# Patient Record
Sex: Male | Born: 1955 | Race: White | Hispanic: No | Marital: Married | Smoking: Current every day smoker
Health system: Southern US, Community
[De-identification: ages and names within clinical notes are randomized; demographics above are authoritative.]

## PROBLEM LIST (undated history)

## (undated) DIAGNOSIS — E538 Deficiency of other specified B group vitamins: Secondary | ICD-10-CM

## (undated) DIAGNOSIS — I514 Myocarditis, unspecified: Secondary | ICD-10-CM

## (undated) DIAGNOSIS — H905 Unspecified sensorineural hearing loss: Secondary | ICD-10-CM

## (undated) DIAGNOSIS — K219 Gastro-esophageal reflux disease without esophagitis: Secondary | ICD-10-CM

## (undated) DIAGNOSIS — N400 Enlarged prostate without lower urinary tract symptoms: Secondary | ICD-10-CM

## (undated) DIAGNOSIS — K224 Dyskinesia of esophagus: Secondary | ICD-10-CM

## (undated) HISTORY — DX: Unspecified sensorineural hearing loss: H90.5

## (undated) HISTORY — DX: Benign prostatic hyperplasia without lower urinary tract symptoms: N40.0

## (undated) HISTORY — DX: Gastro-esophageal reflux disease without esophagitis: K21.9

## (undated) HISTORY — DX: Myocarditis, unspecified: I51.4

## (undated) HISTORY — DX: Deficiency of other specified B group vitamins: E53.8

## (undated) HISTORY — PX: GALLBLADDER SURGERY: SHX652

## (undated) HISTORY — DX: Dyskinesia of esophagus: K22.4

## (undated) HISTORY — DX: Gilbert syndrome: E80.4

---

## 2001-03-16 DIAGNOSIS — E785 Hyperlipidemia, unspecified: Secondary | ICD-10-CM

## 2001-03-16 HISTORY — DX: Hyperlipidemia, unspecified: E78.5

## 2010-04-30 DIAGNOSIS — F17209 Nicotine dependence, unspecified, with unspecified nicotine-induced disorders: Secondary | ICD-10-CM | POA: Insufficient documentation

## 2010-04-30 HISTORY — DX: Nicotine dependence, unspecified, with unspecified nicotine-induced disorders: F17.209

## 2011-11-05 DIAGNOSIS — Z77090 Contact with and (suspected) exposure to asbestos: Secondary | ICD-10-CM

## 2011-11-05 HISTORY — DX: Contact with and (suspected) exposure to asbestos: Z77.090

## 2011-12-13 DIAGNOSIS — D239 Other benign neoplasm of skin, unspecified: Secondary | ICD-10-CM

## 2011-12-13 HISTORY — DX: Other benign neoplasm of skin, unspecified: D23.9

## 2015-09-21 DIAGNOSIS — M160 Bilateral primary osteoarthritis of hip: Secondary | ICD-10-CM

## 2015-09-21 DIAGNOSIS — S22000A Wedge compression fracture of unspecified thoracic vertebra, initial encounter for closed fracture: Secondary | ICD-10-CM | POA: Insufficient documentation

## 2015-09-21 DIAGNOSIS — M519 Unspecified thoracic, thoracolumbar and lumbosacral intervertebral disc disorder: Secondary | ICD-10-CM | POA: Insufficient documentation

## 2015-09-21 DIAGNOSIS — J449 Chronic obstructive pulmonary disease, unspecified: Secondary | ICD-10-CM

## 2015-09-21 HISTORY — DX: Wedge compression fracture of unspecified thoracic vertebra, initial encounter for closed fracture: S22.000A

## 2015-09-21 HISTORY — DX: Chronic obstructive pulmonary disease, unspecified: J44.9

## 2015-09-21 HISTORY — DX: Bilateral primary osteoarthritis of hip: M16.0

## 2015-09-21 HISTORY — DX: Unspecified thoracic, thoracolumbar and lumbosacral intervertebral disc disorder: M51.9

## 2017-02-24 DIAGNOSIS — R739 Hyperglycemia, unspecified: Secondary | ICD-10-CM

## 2017-02-24 HISTORY — DX: Hyperglycemia, unspecified: R73.9

## 2017-03-03 DIAGNOSIS — D751 Secondary polycythemia: Secondary | ICD-10-CM | POA: Insufficient documentation

## 2017-03-03 HISTORY — DX: Secondary polycythemia: D75.1

## 2018-04-28 DIAGNOSIS — F329 Major depressive disorder, single episode, unspecified: Secondary | ICD-10-CM

## 2018-04-28 HISTORY — DX: Major depressive disorder, single episode, unspecified: F32.9

## 2019-02-23 DIAGNOSIS — R292 Abnormal reflex: Secondary | ICD-10-CM

## 2019-02-23 HISTORY — DX: Abnormal reflex: R29.2

## 2019-06-21 ENCOUNTER — Encounter: Payer: Self-pay | Admitting: Neurology

## 2019-06-29 ENCOUNTER — Encounter: Payer: Self-pay | Admitting: Neurology

## 2019-07-25 NOTE — Progress Notes (Addendum)
NEUROLOGY CONSULTATION NOTE  Dennis Dalton MRN: [id number] DOB: 09/25/1955  Referring provider: Eber Hong, MD Primary care provider: Eber Hong, MD  Reason for consult:  Memory deficits, speech abnormality, abnormal MRI  HISTORY OF PRESENT ILLNESS: Dennis Dalton is a 63 year old right-handed male who presents for cognitive deficits and speech disturbance.  He is accompanied by his wife who supplements most of history.  In 2018, he started exhibiting cognitive deficits.  He has word-finding difficulty.  He has short-term memory issues such as forgetting recent conversations.  He frequently repeat questions.  He has some trouble recognizing familiar people.  One time, he couldn't remember his daughter's name.  He has little trouble with everyday tasks.  He is able to perform ADLs such as dressing, bathing, and feeding self.  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  He has not had any problems performing his duties at work.  He keeps tabs of multiple keys and knows which keys unlock specific doors.  He was at a stop light and briefly zoned out for a couple of seconds.  It was believed that he may have had a TIA.  He reports that he does not get lost on familiar routes.  He does report depressed mood.  He does not want to be around people.  He has not had any episodes of confusion.  No hallucinations or delusions.  Symptoms have mildly progressed since onset.  MRI of brain without contrast on 04/21/2017 showed T2 and FLAIR hyperintensities in the subcortical, deep and periventricular regions.  Report notes that orientation was perpendicular to the ventricles, raising concern for multiple sclerosis.  He followed up with neurology.  He underwent LP which did not reveal findings consistent with multiple sclerosis.  Neurology believed findings were consistent with chronic small vessel ischemic changes.  He was found to have B12 deficiency and started on supplementation.   Education:   12th grade Smoker since age 43.  1 ppd No liquor.  Drinks 3 to 4 beers a week.  He was a heavy drinker for many years up until a year ago. Brothers passed away at young age.  Parents no dementia.    02/23/2019 LABS:  CBC with WBC 9, HGB 16, HCT 45.7, PLT 215; CMP with Na 136, K 4.2, Cl 101, CO2 25, BUN 12. Cr 0.90, t bili 1.6, ALP 58, AST 12. ALT 17; B12 928; Sed rate 2, CRP <0.40 10/25/2018 LABS:  TSH 0.74  PAST MEDICAL HISTORY: Past Medical History:  Diagnosis Date  . B12 deficiency   . Depression     PAST SURGICAL HISTORY: Past Surgical History:  Procedure Laterality Date  . GALLBLADDER SURGERY      MEDICATIONS: Current Outpatient Medications on File Prior to Visit  Medication Sig Dispense Refill  . Apoaequorin (PREVAGEN) 10 MG CAPS Take by mouth.    Marland Kitchen aspirin EC 81 MG tablet Take by mouth.    . cyanocobalamin (,VITAMIN B-12,) 1000 MCG/ML injection Inject into the muscle.    Marland Kitchen SYRINGE-NEEDLE, DISP, 3 ML (B-D INTEGRA SYRINGE) 25G X 5/8" 3 ML MISC by Does not apply route.    Marland Kitchen UNABLE TO FIND NEURIVA OTC TAKE ONE CAPSULE A DAY     No current facility-administered medications on file prior to visit.     ALLERGIES: No Known Allergies  FAMILY HISTORY: Family History  Problem Relation Age of Onset  . Lung cancer Mother        smoker  .  Lung cancer Father        smoker  . Heart Problems Brother        enlarge heart x1 1 brother death due to car accident   . Drug abuse Brother   . Healthy Child   .  SOCIAL HISTORY: Social History   Socioeconomic History  . Marital status: Married    Spouse name: Not on file  . Number of children: 2  . Years of education: Not on file  . Highest education level: High school graduate  Occupational History  . Not on file  Social Needs  . Financial resource strain: Not on file  . Food insecurity    Worry: Not on file    Inability: Not on file  . Transportation needs    Medical: Not on file    Non-medical: Not on file  Tobacco Use   . Smoking status: Current Every Day Smoker  . Smokeless tobacco: Never Used  Substance and Sexual Activity  . Alcohol use: Yes    Alcohol/week: 1.0 standard drinks    Types: 1 Cans of beer per week  . Drug use: Never  . Sexual activity: Not on file  Lifestyle  . Physical activity    Days per week: Not on file    Minutes per session: Not on file  . Stress: Not on file  Relationships  . Social Herbalist on phone: Not on file    Gets together: Not on file    Attends religious service: Not on file    Active member of club or organization: Not on file    Attends meetings of clubs or organizations: Not on file    Relationship status: Not on file  . Intimate partner violence    Fear of current or ex partner: Not on file    Emotionally abused: Not on file    Physically abused: Not on file    Forced sexual activity: Not on file  Other Topics Concern  . Not on file  Social History Narrative   Pt is married lives with spouse   He has 5 children total 3 are biological children   Right handed   Drinks soda 4-5 a day, no coffee, no tea    REVIEW OF SYSTEMS: Constitutional: No fevers, chills, or sweats, no generalized fatigue, change in appetite Eyes: No visual changes, double vision, eye pain Ear, nose and throat: No hearing loss, ear pain, nasal congestion, sore throat Cardiovascular: No chest pain, palpitations Respiratory:  No shortness of breath at rest or with exertion, wheezes GastrointestinaI: No nausea, vomiting, diarrhea, abdominal pain, fecal incontinence Genitourinary:  No dysuria, urinary retention or frequency Musculoskeletal:  No neck pain, back pain Integumentary: No rash, pruritus, skin lesions Neurological: as above Psychiatric: No depression, insomnia, anxiety Endocrine: No palpitations, fatigue, diaphoresis, mood swings, change in appetite, change in weight, increased thirst Hematologic/Lymphatic:  No purpura, petechiae. Allergic/Immunologic: no  itchy/runny eyes, nasal congestion, recent allergic reactions, rashes  PHYSICAL EXAM: Blood pressure 132/79, pulse 76, height 5\' 9"  (1.753 m), weight 147 lb 3.2 oz (66.8 kg), SpO2 97 %. General: No acute distress.  Patient appears well-groomed.   Head:  Normocephalic/atraumatic Eyes:  fundi examined but not visualized Neck: supple, no paraspinal tenderness, full range of motion Back: No paraspinal tenderness Heart: regular rate and rhythm Lungs: Clear to auscultation bilaterally. Vascular: No carotid bruits. Neurological Exam: Mental status: [employer] Mental Exam 07/26/2019  Weekday Correct 0  Current year 0  What  state are we in? 1  Amount spent 0  Amount left 0  # of Animals 1  5 objects recall 0  Number series 0  Hour markers 1  Time correct 0  Placed X in triangle correctly 1  Largest Figure 1  Name of male 0  Date back to work 0  Type of work 0  State she lived in 0  Total score 5   Cranial nerves: CN I: not tested CN II: pupils equal, round and reactive to light, visual fields intact CN III, IV, VI:  full range of motion, no nystagmus, no ptosis CN V: facial sensation intact CN VII: upper and lower face symmetric CN VIII: hearing intact CN IX, X: gag intact, uvula midline CN XI: sternocleidomastoid and trapezius muscles intact CN XII: tongue midline Bulk & Tone: normal, no fasciculations. Motor:  5/5 throughout Sensation: temperature and vibration sensation intact. Deep Tendon Reflexes:  3+ throughout, toes downgoing.  Finger to nose testing:  Without dysmetria.  Heel to shin:  Without dysmetria.  Gait:  Normal station and stride.  Able to turn and tandem walk. Romberg negative.  IMPRESSION: 1.  Neurocognitive disorder.  He scored very low on SLUMS, suggesting severe dementia.  However, he appears to be more independent than his performance on cognitive testing.  This may suggest that other factors, such as depression and decreased effort, have  played a role in his poor performance.  Regardless, based on this score, I have advised that he should not drive until further notice.  Cerebrovascular disease and history of alcoholism may be probable etiology for his cognitive impairment.   2.  Abnormal white matter on brain MRI.  Questionable multiple sclerosis.  Prior workup reportedly negative for MS.  Likely cerebrovascular disease.  Imaging not available.  Will repeat. 3.  Hyperreflexia.  May be physiologic but given his prior MRI brain findings, I would like to image the cervical spine to evaluate for any potential lesion causing a myelopathy.  PLAN: 1.  Check MRI of brain and cervical spine with and without contrast 2.  Check B1, B12, TSH, RPR 3.  Recommend formal neuropsychological testing for more sensitive evaluation of cognition and help determine if severity is also affected by other factors such as depression.  Until this is performed, he should not drive until further notice. 4.  Suggested starting donepezil.  He defers until after testing completed. 5.  Management of stroke risk factors:  ASA, blood pressure and cholesterol control 6.  Follow up after testing.  7.  ADDENDUM:  Start donepezil 5mg  at bedtime for one month, then increase to 10mg  at bedtime  Thank you for allowing me to take part in the care of this patient.  Metta Clines, DO  CC:  Eber Hong, MD

## 2019-07-26 ENCOUNTER — Ambulatory Visit (INDEPENDENT_AMBULATORY_CARE_PROVIDER_SITE_OTHER): Payer: 59 | Admitting: Neurology

## 2019-07-26 ENCOUNTER — Encounter: Payer: Self-pay | Admitting: Neurology

## 2019-07-26 ENCOUNTER — Other Ambulatory Visit: Payer: Self-pay

## 2019-07-26 ENCOUNTER — Other Ambulatory Visit (INDEPENDENT_AMBULATORY_CARE_PROVIDER_SITE_OTHER): Payer: 59

## 2019-07-26 ENCOUNTER — Ambulatory Visit: Payer: 59 | Admitting: Neurology

## 2019-07-26 VITALS — BP 132/79 | HR 76 | Ht 69.0 in | Wt 147.2 lb

## 2019-07-26 DIAGNOSIS — R9082 White matter disease, unspecified: Secondary | ICD-10-CM | POA: Diagnosis not present

## 2019-07-26 DIAGNOSIS — F17200 Nicotine dependence, unspecified, uncomplicated: Secondary | ICD-10-CM

## 2019-07-26 DIAGNOSIS — R292 Abnormal reflex: Secondary | ICD-10-CM | POA: Diagnosis not present

## 2019-07-26 DIAGNOSIS — F1011 Alcohol abuse, in remission: Secondary | ICD-10-CM

## 2019-07-26 DIAGNOSIS — R419 Unspecified symptoms and signs involving cognitive functions and awareness: Secondary | ICD-10-CM

## 2019-07-26 NOTE — Patient Instructions (Addendum)
The score on the cognitive test suggests dementia.  However, other factors such as depression may be playing a role. 1.  We will check blood work:  TSH, B12, B1, RPR 2.  I want to schedule MRI of brain and cervical spine with and without contrast 3.  We will schedule for neurocognitive testing with Dr. Melvyn Novas 4.  It is my medical opinion that you should not drive until further notice. 5.  Follow up after all testing completed (it may be a virtual visit)  Your provider has requested that you have labwork completed today. Please go to [facility] ([address]) on the second floor of this building before leaving the office today. You do not need to check in. If you are not called within 15 minutes please check with the front desk.   A referral to [facility] has been placed for your MRI someone will contact you directly to schedule your appt. They are located at [place]. Please contact them directly by calling [phone] with any questions regarding your referral.    You have been referred for a neurocognitive evaluation in our office.   The evaluation has two parts.   . The first part of the evaluation is a clinical interview with the neuropsychologist (Dr. Melvyn Novas or Dr. Nicole Kindred). Please bring someone with you to this appointment if possible, as it is helpful for the doctor to hear from both you and another adult who knows you well.   . The second part of the evaluation is testing with the doctor's technician Hinton Dyer or Maudie Mercury). The testing includes a variety of tasks- mostly question-and-answer, some paper-and-pencil. There is nothing you need to do to prepare for this appointment, but having a good night's sleep prior to the testing, taking medications as you normally would, and bringing eyeglasses and hearing aids (if you wear them), is advised. Please make sure that you wear a mask to the appointment.  Please note: We have to reserve several hours of the  neuropsychologist's time and the psychometrician's time for your evaluation appointment. As such, please note that there is a No-Show fee of $100. If you are unable to attend any of your appointments, please contact our office as soon as possible to reschedule.

## 2019-07-27 LAB — VITAMIN B12: Vitamin B-12: 594 pg/mL (ref 211–911)

## 2019-07-27 LAB — TSH: TSH: 0.72 u[IU]/mL (ref 0.35–4.50)

## 2019-07-28 ENCOUNTER — Telehealth: Payer: Self-pay | Admitting: Neurology

## 2019-07-28 MED ORDER — DONEPEZIL HCL 5 MG PO TABS: 5.0000 mg | ORAL_TABLET | Freq: Every day | ORAL | 5 refills | Status: DC

## 2019-07-28 NOTE — Telephone Encounter (Signed)
Left message with after hour service on 07-28-19 @ 12:28 pm   Caller wants to speak to someone about a medication she would like to husband to take she is wanting to know if the medication RX can be faxed to pharmacy

## 2019-07-28 NOTE — Telephone Encounter (Signed)
Yes.  I wanted to start donepezil We will start donepezil (Aricept) 5mg  daily for four weeks.  If you are tolerating the medication, then after four weeks, we will increase the dose to 10mg  daily.  Side effects include nausea, vomiting, diarrhea, vivid dreams, and muscle cramps.  Please call the clinic if you experience any of these symptoms.

## 2019-07-28 NOTE — Telephone Encounter (Addendum)
See most recent telephone note Rx was sent this morning.  Will call spouse back to discuss Spoke with spouse she was informed of provider response from prior telephone note

## 2019-07-28 NOTE — Telephone Encounter (Signed)
PLAN: 1.  Check MRI of brain and cervical spine with and without contrast 2.  Check B1, B12, TSH, RPR 3.  Recommend formal neuropsychological testing for more sensitive evaluation of cognition and help determine if severity is also affected by other factors such as depression.  Until this is performed, he should not drive until further notice. 4.  Suggested starting donepezil.  He defers until after testing completed. 5.  Management of stroke risk factors:  ASA, blood pressure and cholesterol control 6.  Follow up after testing.     ~Dr. Tomi Likens Was there something else you were going to start patient on?

## 2019-07-28 NOTE — Telephone Encounter (Signed)
Rx sent Will informed spouse when she returns call

## 2019-07-28 NOTE — Telephone Encounter (Signed)
Wife is calling in about the new medication that was supposed to be called in. She couldn't remember the name was talked about during last visit. Please send to the [facility] on file. Thanks!

## 2019-07-28 NOTE — Telephone Encounter (Signed)
Tried calling patient spouse again no answer left provider response below

## 2019-07-30 LAB — VITAMIN B1: Vitamin B1 (Thiamine): 7 nmol/L — ABNORMAL LOW (ref 8–30)

## 2019-07-30 LAB — RPR: RPR Ser Ql: NONREACTIVE

## 2019-08-01 ENCOUNTER — Telehealth: Payer: Self-pay | Admitting: Neurology

## 2019-08-01 ENCOUNTER — Telehealth: Payer: Self-pay

## 2019-08-01 NOTE — Telephone Encounter (Signed)
-----   Message from Pieter Partridge, DO sent at 07/31/2019  2:33 PM EST ----- B1 (thiamine) level is borderline low.  This may affect memory.  I would like him to start thiamine 100mg  daily.  Other labs are okay

## 2019-08-01 NOTE — Telephone Encounter (Signed)
Please call wife regarding conversation with patient. She also has a question about Prevagen and MRI being scheduled. Please Call wife on Cell phone. Thanks

## 2019-08-01 NOTE — Telephone Encounter (Signed)
Called patient spouse no answer left message to call office back to discuss her questions and concerns

## 2019-08-01 NOTE — Telephone Encounter (Signed)
Called spoke with patient he has started medication and will call office in 4 weeks to send in new rx to increase dose.

## 2019-08-01 NOTE — Telephone Encounter (Signed)
Pt spouse return call she was informed of lab results and that patient should start Thiamine 100 mg daily. She was also given number to [place] regarding scheduling his appt.

## 2019-08-01 NOTE — Telephone Encounter (Signed)
Called spoke with patient he was informed of results and understands will pick up otc thiamine 100 mg at local pharmacy.

## 2019-08-25 ENCOUNTER — Telehealth: Payer: Self-pay | Admitting: Neurology

## 2019-08-25 NOTE — Telephone Encounter (Signed)
Tried calling. No answer. Pt does have (2) MRI's scheduled on 09/03/18 at [facility]. He needs to arrive at 2:20 alone and with a mask.  Wife informed

## 2019-08-25 NOTE — Telephone Encounter (Signed)
Patient has not heard from Imaging office to set up MRI. He was checking on the status of the Referral. Please Call. Thank you

## 2019-09-04 ENCOUNTER — Other Ambulatory Visit: Payer: 59

## 2019-09-11 ENCOUNTER — Other Ambulatory Visit: Payer: Self-pay | Admitting: Neurology

## 2019-09-11 ENCOUNTER — Ambulatory Visit
Admission: RE | Admit: 2019-09-11 | Discharge: 2019-09-11 | Disposition: A | Discharge status: Home / Self Care | Payer: 59 | Source: Ambulatory Visit | Attending: Neurology | Admitting: Neurology

## 2019-09-11 ENCOUNTER — Other Ambulatory Visit: Payer: Self-pay

## 2019-09-11 DIAGNOSIS — R419 Unspecified symptoms and signs involving cognitive functions and awareness: Secondary | ICD-10-CM

## 2019-09-11 DIAGNOSIS — F17200 Nicotine dependence, unspecified, uncomplicated: Secondary | ICD-10-CM

## 2019-09-11 DIAGNOSIS — R9082 White matter disease, unspecified: Secondary | ICD-10-CM

## 2019-09-11 DIAGNOSIS — F1011 Alcohol abuse, in remission: Secondary | ICD-10-CM

## 2019-09-11 DIAGNOSIS — R292 Abnormal reflex: Secondary | ICD-10-CM

## 2019-09-12 ENCOUNTER — Telehealth: Payer: Self-pay

## 2019-09-12 NOTE — Telephone Encounter (Signed)
Pt called informed of MRI results per Dr Tomi Likens MRI of brain again shows the abnormal findings seen on previous MRI.  It may be extensive chronic changes from little strokes.  I would continue aspirin 81mg  daily.  He may make a virtual visit or telephone visit for more explanation

## 2019-09-19 ENCOUNTER — Telehealth: Payer: Self-pay | Admitting: Neurology

## 2019-09-19 ENCOUNTER — Other Ambulatory Visit: Payer: Self-pay

## 2019-09-19 MED ORDER — DONEPEZIL HCL 5 MG PO TABS: 5.0000 mg | ORAL_TABLET | Freq: Two times a day (BID) | ORAL | 5 refills | Status: DC

## 2019-09-19 NOTE — Telephone Encounter (Signed)
Pharm told wife that DR will need to send an updated prescription for the change of the donepezil medication. Changed to 10mg  or 2 tablets of the 5mg . Pharm on file is correct. Thanks!

## 2019-09-28 NOTE — Progress Notes (Signed)
Virtual Visit via Video Note The purpose of this virtual visit is to provide medical care while limiting exposure to the novel coronavirus.    Consent was obtained for video visit:  Yes.   Answered questions that patient had about telehealth interaction:  Yes.   I discussed the limitations, risks, security and privacy concerns of performing an evaluation and management service by telemedicine. I also discussed with the patient that there may be a patient responsible charge related to this service. The patient expressed understanding and agreed to proceed.  Pt location: Home Physician Location: office Name of referring provider:  Eber Hong, MD I connected with Dennis Dalton at patients initiation/request on 09/30/2019 at  8:50 AM EST by video enabled telemedicine application and verified that I am speaking with the correct person using two identifiers. Pt MRN:  [id number] Pt DOB:  Dec 11, 1955 Video Participants:  Dennis Dalton; his wife   History of Present Illness:  Dennis Dalton is a 64 year old right-handed male who follows up for cognitive deficits and speech disturbance.  MRIs personally reviewed.  UPDATE: Workup: 07/26/2019 LABS:  TSH 0.72, B12 594, RPR nonreactive.  B1 borderline low at 7.  Advised to start thiamine 100mg  daily. 09/11/2019 MRI BRAIN WO:  Extensive patchy and confluent cerebral white matter T2 and FLAIR signal abnormality.  Impression by radiologist included advanced chronic small vessel disease, CADASIL and less likely demyelination.  MRI CERVICAL SPINE WO:  No cord lesions.  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  He was started on donepezil and advised to continue ASA 81mg  daily.  No significant changes.  HISTORY: In 2018, he started exhibiting cognitive deficits.  He has word-finding difficulty.  He has short-term  memory issues such as forgetting recent conversations.  He frequently repeat questions.  He has some trouble recognizing familiar people.  One time, he couldn't remember his daughter's name.  He has little trouble with everyday tasks.  He is able to perform ADLs such as dressing, bathing, and feeding self.  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  He has not had any problems performing his duties at work.  He keeps tabs of multiple keys and knows which keys unlock specific doors.  He was at a stop light and briefly zoned out for a couple of seconds.  It was believed that he may have had a TIA.  He reports that he does not get lost on familiar routes.  He does report depressed mood.  He does not want to be around people.  He has not had any episodes of confusion.  No hallucinations or delusions.  Symptoms have mildly progressed since onset.  MRI of brain without contrast on 04/21/2017 showed T2 and FLAIR hyperintensities in the subcortical, deep and periventricular regions.  Report notes that orientation was perpendicular to the ventricles, raising concern for multiple sclerosis.  He followed up with neurology.  He underwent LP which did not reveal findings consistent with multiple sclerosis.  Neurology believed findings were consistent with chronic small vessel ischemic changes.  He was found to have B12 deficiency and started on supplementation.   Education:  12th grade Smoker since age 51.  1 ppd No liquor.  Drinks 3 to 4 beers a week.  He was a heavy drinker for many years up until a year ago. Brothers passed away at young age.  Parents  no dementia.    Past Medical History: Past Medical History:  Diagnosis Date   B12 deficiency    Depression     Medications: Outpatient Encounter Medications as of 09/30/2019  Medication Sig   Apoaequorin (PREVAGEN) 10 MG CAPS Take by mouth.   aspirin EC 81 MG tablet Take by mouth.   cyanocobalamin (,VITAMIN B-12,) 1000 MCG/ML  injection Inject into the muscle.   donepezil (ARICEPT) 5 MG tablet Take 1 tablet (5 mg total) by mouth 2 (two) times daily.   SYRINGE-NEEDLE, DISP, 3 ML (B-D INTEGRA SYRINGE) 25G X 5/8" 3 ML MISC by Does not apply route.   UNABLE TO FIND NEURIVA OTC TAKE ONE CAPSULE A DAY   No facility-administered encounter medications on file as of 09/30/2019.    Allergies: No Known Allergies  Family History: Family History  Problem Relation Age of Onset   Lung cancer Mother        smoker   Lung cancer Father        smoker   Heart Problems Brother        enlarge heart x1 1 brother death due to car accident    Drug abuse Brother    Healthy Child     Social History: Social History   Socioeconomic History   Marital status: Married    Spouse name: Not on file   Number of children: 2   Years of education: Not on file   Highest education level: High school graduate  Occupational History   Not on file  Tobacco Use   Smoking status: Current Every Day Smoker   Smokeless tobacco: Never Used  Substance and Sexual Activity   Alcohol use: Yes    Alcohol/week: 1.0 standard drinks    Types: 1 Cans of beer per week   Drug use: Never   Sexual activity: Not on file  Other Topics Concern   Not on file  Social History Narrative   Pt is married lives with spouse   He has 5 children total 3 are biological children   Right handed   Drinks soda 4-5 a day, no coffee, no tea   Social Determinants of Radio broadcast assistant Strain:    Difficulty of Paying Living Expenses: Not on file  Food Insecurity:    Worried About Charity fundraiser in the Last Year: Not on file   [employer] of Food in the Last Year: Not on file  Transportation Needs:    Lack of Transportation (Medical): Not on file   Lack of Transportation (Non-Medical): Not on file  Physical Activity:    Days of Exercise per Week: Not on file   Minutes of Exercise per Session: Not on file  Stress:    Feeling  of Stress : Not on file  Social Connections:    Frequency of Communication with Friends and Family: Not on file   Frequency of Social Gatherings with Friends and Family: Not on file   Attends Religious Services: Not on file   Active Member of Clubs or Organizations: Not on file   Attends Archivist Meetings: Not on file   Marital Status: Not on file  Intimate Partner Violence:    Fear of Current or Ex-Partner: Not on file   Emotionally Abused: Not on file   Physically Abused: Not on file   Sexually Abused: Not on file    Observations/Objective:   Height 5\' 9"  (1.753 m), weight 140 lb (63.5 kg). No acute distress.  Alert and oriented.  Speech fluent and not dysarthric.  Language intact.    Assessment and Plan:   1. Abnormal cerebral white matter.  Severe chronic small vessel disease.  Presentation on MRI raised possibility of CADASIL.  I do not suspect demyelinating disease. 2.  Neurocognitive disorder, likely vascular.  1.  ASA 81mg  daily 2.  Aricept 10mg  at bedtime 3.  Check CTA of head and neck 4.  Check lipid panel 5.  Neuropsychological testing scheduled in April 6.  Mrs. Sixto will contact their insurance to find out the potential expense of genetic testing for CADASIL (evaluating for NOTCH3 gene mutation). 7.  Follow up in 4 months or sooner if needed.  Follow Up Instructions:    -I discussed the assessment and treatment plan with the patient. The patient was provided an opportunity to ask questions and all were answered. The patient agreed with the plan and demonstrated an understanding of the instructions.   The patient was advised to call back or seek an in-person evaluation if the symptoms worsen or if the condition fails to improve as anticipated.   Dennis Major, DO

## 2019-09-30 ENCOUNTER — Encounter: Payer: Self-pay | Admitting: Neurology

## 2019-09-30 ENCOUNTER — Telehealth (INDEPENDENT_AMBULATORY_CARE_PROVIDER_SITE_OTHER): Payer: 59 | Admitting: Neurology

## 2019-09-30 ENCOUNTER — Other Ambulatory Visit: Payer: Self-pay

## 2019-09-30 VITALS — Ht 69.0 in | Wt 140.0 lb

## 2019-09-30 DIAGNOSIS — R419 Unspecified symptoms and signs involving cognitive functions and awareness: Secondary | ICD-10-CM | POA: Diagnosis not present

## 2019-09-30 DIAGNOSIS — R9082 White matter disease, unspecified: Secondary | ICD-10-CM

## 2019-09-30 DIAGNOSIS — I679 Cerebrovascular disease, unspecified: Secondary | ICD-10-CM

## 2019-09-30 DIAGNOSIS — R292 Abnormal reflex: Secondary | ICD-10-CM

## 2019-09-30 MED ORDER — DONEPEZIL HCL 10 MG PO TABS: 10.0000 mg | ORAL_TABLET | Freq: Every day | ORAL | 5 refills | Status: DC

## 2019-11-08 ENCOUNTER — Ambulatory Visit
Admission: RE | Admit: 2019-11-08 | Discharge: 2019-11-08 | Disposition: A | Discharge status: Home / Self Care | Payer: 59 | Source: Ambulatory Visit | Attending: Neurology | Admitting: Neurology

## 2019-11-08 ENCOUNTER — Other Ambulatory Visit: Payer: Self-pay

## 2019-11-08 DIAGNOSIS — R419 Unspecified symptoms and signs involving cognitive functions and awareness: Secondary | ICD-10-CM

## 2019-11-08 DIAGNOSIS — R9082 White matter disease, unspecified: Secondary | ICD-10-CM

## 2019-11-08 DIAGNOSIS — R292 Abnormal reflex: Secondary | ICD-10-CM

## 2019-11-08 MED ORDER — IOPAMIDOL (ISOVUE-370) INJECTION 76%
75.0000 mL | Freq: Once | INTRAVENOUS | Status: AC | PRN
  Administered 2019-11-08: 75 mL via INTRAVENOUS

## 2019-11-10 ENCOUNTER — Telehealth: Payer: Self-pay | Admitting: Neurology

## 2019-11-10 NOTE — Telephone Encounter (Signed)
Patient's wife called and left a message stating she is returning Heather's call about results.

## 2019-11-10 NOTE — Telephone Encounter (Signed)
Left message of the above

## 2019-12-06 ENCOUNTER — Ambulatory Visit: Payer: 59 | Admitting: Psychology

## 2019-12-06 ENCOUNTER — Ambulatory Visit (INDEPENDENT_AMBULATORY_CARE_PROVIDER_SITE_OTHER): Payer: 59 | Admitting: Psychology

## 2019-12-06 ENCOUNTER — Other Ambulatory Visit: Payer: Self-pay

## 2019-12-06 ENCOUNTER — Encounter: Payer: Self-pay | Admitting: Psychology

## 2019-12-06 DIAGNOSIS — N529 Male erectile dysfunction, unspecified: Secondary | ICD-10-CM | POA: Insufficient documentation

## 2019-12-06 DIAGNOSIS — F339 Major depressive disorder, recurrent, unspecified: Secondary | ICD-10-CM | POA: Diagnosis not present

## 2019-12-06 DIAGNOSIS — R4189 Other symptoms and signs involving cognitive functions and awareness: Secondary | ICD-10-CM | POA: Diagnosis not present

## 2019-12-06 DIAGNOSIS — K219 Gastro-esophageal reflux disease without esophagitis: Secondary | ICD-10-CM | POA: Insufficient documentation

## 2019-12-06 DIAGNOSIS — H905 Unspecified sensorineural hearing loss: Secondary | ICD-10-CM | POA: Insufficient documentation

## 2019-12-06 DIAGNOSIS — I514 Myocarditis, unspecified: Secondary | ICD-10-CM | POA: Insufficient documentation

## 2019-12-06 DIAGNOSIS — E538 Deficiency of other specified B group vitamins: Secondary | ICD-10-CM | POA: Insufficient documentation

## 2019-12-06 DIAGNOSIS — I428 Other cardiomyopathies: Secondary | ICD-10-CM | POA: Insufficient documentation

## 2019-12-06 DIAGNOSIS — N400 Enlarged prostate without lower urinary tract symptoms: Secondary | ICD-10-CM | POA: Insufficient documentation

## 2019-12-06 DIAGNOSIS — K224 Dyskinesia of esophagus: Secondary | ICD-10-CM | POA: Insufficient documentation

## 2019-12-06 NOTE — Progress Notes (Addendum)
   Psychometrician Note   Cognitive testing was administered to Dennis Dalton by Milana Kidney, B.S. (psychometrist) under the supervision of Dr. Christia Reading, Ph.D., licensed psychologist.   Shortly following the initiation of testing procedures, [name] expressed a desire to discontinue testing, emphasizing that he does not exhibit any cognitive difficulties and that the current testing is "not going to help me because nothing is wrong." Upon expressing this, [name] relayed these concerns to Dr. Melvyn Novas. Following a brief conversation, [name] agreed to complete approximately 30 minutes of testing representing a cognitive screening battery. However, Mr. Areola ultimately discontinued testing prematurely and left the testing office.    [name] will return within approximately 1-2 weeks for an interactive feedback session with Dr. Melvyn Novas at which time his test performances, clinical impressions, and treatment recommendations will be reviewed in detail. [name] understands he can contact our office should he require our assistance before this time.  A total of 30 minutes of billable time were spent face-to-face with [name] by the psychometrist. This includes both test administration and scoring time. Billing for these services is reflected in the clinical report generated by Dr. Christia Reading, Ph.D.  This note reflects time spent with the psychometrician and does not include test scores or any clinical interpretations made by Dr. Melvyn Novas. The full report will follow in a separate note.

## 2019-12-06 NOTE — Progress Notes (Signed)
NEUROPSYCHOLOGICAL EVALUATION [place] [place] Department of Neurology  Reason for Referral:   Dennis Dalton is a 64 y.o. right-handed Caucasian male referred by Metta Clines, D.O., to characterize his current cognitive functioning and assist with diagnostic clarity and treatment planning in the context of abnormal neuroimaging and concerns regarding cognitive decline.  Assessment and Plan:   Clinical Impression(s): Shortly following the initiation of testing procedures, Dennis Dalton expressed a desire to discontinue testing, emphasizing that he does not exhibit any cognitive difficulties and that the current testing is "not going to help me because nothing is wrong." Upon expressing this, the psychometrist relayed these concerns to myself. Following a brief conversation, Dennis Dalton agreed to complete approximately 30 minutes of testing representing a cognitive screening battery. However, Dennis Dalton ultimately discontinued testing prematurely and left the testing office, stating that he would never return. As such, a comprehensive evaluation was unable to be completed. Tasks that were completed are believed to be invalid due to limited testing compliance. They are provided at the conclusion of the report for the sake of completion but no interpretation was performed. Across mood-related questionnaires, he answered each item with a "50%" value and did not appear to actually read the symptoms he was endorsing.  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 Cognitive deficits would certainly be expected with this level of white matter abnormality, with the most likely affected cognitive domains including processing speed, attention/concentration, executive functioning, and learning  and memory. While testing was unable to be completed, there is a strong likelihood that he would meet mild vascular neurocognitive disorder diagnostic criteria. Dennis Dalton did report that his wife manages their personal finances and his medications; he was unclear if he could do this independently. Some instrumental activities of daily living (ADL) disruption raises concerns for a major vascular neurocognitive disorder (i.e., vascular dementia) presentation. However, this diagnosis appears premature as his wife denied concerns surrounding leaving her husband at home alone for extended periods of time (e.g. a weekend) and the extent of suspected cognitive dysfunction could not be objectively examined. Continued medical monitoring will be important moving forward.   Recommendations: Dennis Dalton stated that he would "never" return for cognitive testing. Should this feeling change, a comprehensive assessment would be beneficial in classifying strengths and weaknesses and lead to improved diagnostic clarity.   Genetic testing to rule in or out a CADASIL presentation would be recommended. However, the cost of this is currently unknown. In all likelihood, in a best case scenario, this would allow for greater diagnostic precision but likely would not change active treatment methods much. As such, he and his wife are encouraged to consider the pros and cons of this testing and discuss with Dr. Tomi Likens as needed.   It is imperative that Dennis Dalton take his medications as prescribed by his physicians rather than designing his own schedule (his wife suggested that he does this with some medications). Dosing schedules are created to maximize drug effectiveness and symptom reduction. This is especially true for cardiovascular-related medications aimed to slowing the progression of brain changes.   Dennis Dalton answered each mood-related question on related questionnaires with a "50%" value and did not appear to actually read  the symptoms he was endorsing. As such, the true extent of current psychiatric symptoms are unclear. However, consideration of medications to address  likely mild to moderate symptoms of anxiety and depression are worth considering.  Dennis Dalton is encouraged to attend to lifestyle factors for brain health (e.g., regular physical exercise, good nutrition habits, regular participation in cognitively-stimulating activities, and general stress management techniques), which are likely to have benefits for both emotional adjustment and cognition. In fact, in addition to promoting good general health, regular exercise incorporating aerobic activities (e.g., brisk walking, jogging, cycling, etc.) has been demonstrated to be a very effective treatment for depression and stress, with similar efficacy rates to both antidepressant medication and psychotherapy. Optimal control of vascular risk factors (including safe cardiovascular exercise and adherence to dietary recommendations) is encouraged.   Should there be a progression of his current deficits over time, Dennis Dalton is unlikely to regain any independent living skills lost. Therefore, it is recommended that he remain as involved as possible in all aspects of household chores, finances, and medication management, with supervision to ensure adequate performance. He will likely benefit from the establishment and maintenance of a routine in order to maximize his functional abilities over time.  It will be important for Dennis Dalton to have another person with him when in situations where he may need to process information, weigh the pros and cons of different options, and make decisions, in order to ensure that he fully understands and recalls all information to be considered.  Review of Records:   Dennis Dalton was seen by [facility] Metta Clines, D.O.) on 09/30/2019 for follow-up of cognitive deficits and speech disturbances. Briefly, Dennis Dalton started exhibiting  cognitive deficits in 2018. These most prominently surrounded word-finding difficulty and short-term memory concerns (e.g., forgetting recent conversations, asking repetitive questions, and trouble recognizing familiar people). Trouble performing basic ADLs was denied. 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 Dr. Tomi Likens noted one instance where Dennis Dalton was at a stop light and briefly zoned out for a couple of seconds, possibly representing a TIA. Dennis Dalton denied gettinglost on familiar routes. He did acknowledge depressed mood, stating that he does not want to be around people.Episodes of confusion or hallucinations were denied. Symptoms were said to have mildly progressed since onset. Given suspected cognitive dysfunction, he was referred for a comprehensive neuropsychological evaluation to characterize his cognitive abilities and to assist with diagnostic clarity and treatment planning.   Brain MRI on 09/11/2019 revealed extensive T2 and FLAIR signal abnormalities affecting the cerebral hemispheric white matter in a patchy and confluent fashion. White matter abnormalities were said to be markedly advanced for age. 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 A demyelinating disease was also said to be a consideration, but was less favored.  Past Medical History:  Diagnosis Date  . B12 deficiency   . Bilateral hip joint arthritis 09/21/2015  . BPH (benign prostatic hyperplasia)   . Closed compression fracture of thoracic vertebra 09/21/2015  . Contact with and (suspected) exposure to asbestos 11/05/2011  . COPD (chronic obstructive pulmonary disease), mild 09/21/2015  .  Dysplastic nevi 12/13/2011  . Esophageal spasm   . GERD (gastroesophageal reflux disease)   . Gilbert's syndrome   . Hyperglycemia 02/24/2017  . Hyperlipidemia, unspecified 03/16/2001   IMO Load 2018 R1.1  . Hyperreflexia 02/23/2019  . Lumbar disc disease 09/21/2015  .  Major depressive disorder 04/28/2018  . Myocarditis   . Polycythemia 03/03/2017  . Sensorineural hearing loss   . Tobacco use disorder 04/30/2010    Past Surgical History:  Procedure Laterality Date  . GALLBLADDER SURGERY      Current Outpatient Medications:  .  Apoaequorin (PREVAGEN) 10 MG CAPS, Take by mouth., Disp: , Rfl:  .  aspirin EC 81 MG tablet, Take by mouth., Disp: , Rfl:  .  cyanocobalamin (,VITAMIN B-12,) 1000 MCG/ML injection, Inject into the muscle., Disp: , Rfl:  .  donepezil (ARICEPT) 10 MG tablet, Take 1 tablet (10 mg total) by mouth at bedtime., Disp: 30 tablet, Rfl: 5 .  SYRINGE-NEEDLE, DISP, 3 ML (B-D INTEGRA SYRINGE) 25G X 5/8" 3 ML MISC, by Does not apply route., Disp: , Rfl:  .  UNABLE TO FIND, NEURIVA OTC TAKE ONE CAPSULE A DAY, Disp: , Rfl:   Clinical Interview:   Cognitive Symptoms: Decreased short-term memory: Denied. Per medical records, concerns surrounding mildly progressive short-term memory difficulties (e.g., forgetting recent conversations, asking repetitive questions, and trouble recognizing familiar people) have been expressed. His wife further added trouble recalling the completion of prior medical tests (e.g., brain MRI) or participation in prior appointments.  Decreased long-term memory: Denied. Decreased attention/concentration: Denied. Reduced processing speed: Denied. Difficulties with executive functions: Denied. Personality changes were also denied.  Difficulties with emotion regulation: Denied. However, his wife reported increased mood volatility where he has a short fuse, is easily angered, and will occasionally stay angry for several days. She also reported that he has become very  argumentative.  Difficulties with receptive language: Denied. Difficulties with word finding: Endorsed. Dennis Dalton reported occasional difficulties which were described as longstanding in nature. His wife noted increasing instances where he is unable to "get the words out" and articulate himself adequately.  Decreased visuoperceptual ability: Denied.  Trajectory of deficits: Dennis Dalton denied the presence of any cognitive dysfunction.   Difficulties completing ADLs: Unclear. His wife manages their finances and organizes his medications. It was unclear if this was due to Dennis Dalton's inability to complete these tasks or his wife's desire to ensure that they are completed appropriately. Mr. Ferlita reported driving without difficulty.   Additional Medical History: History of traumatic brain injury/concussion: Denied. History of stroke: Unclear. His wife reported Dr. Tomi Likens mentioning the possibility of "silent strokes" occurring in the past, potentially associated with CADASIL.  History of seizure activity: Denied. History of known exposure to toxins: Denied. Symptoms of chronic pain: Denied. Experience of frequent headaches/migraines: Denied. Frequent instances of dizziness/vertigo: Denied.  Sensory changes: Mr. Brotherson wears glasses with positive effect. No other sensory changes/difficulties (e.g., hearing, taste, or smell) were reported.  Balance/coordination difficulties: Denied. Other motor difficulties: Denied.  Sleep History: Estimated hours obtained each night: 7 hours. Difficulties falling asleep: Endorsed. Rare difficulties were reported, generally attributed to having several things on his mind which prevent him from falling asleep quickly.  Difficulties staying asleep: Denied. Feels rested and refreshed upon awakening: Endorsed.  History of snoring: Endorsed. History of waking up gasping for air: Endorsed. Witnessed breath cessation while asleep: Denied. He reported trialing a  CPAP machine in the past for five minutes before discontinuing its use. He stated that he would "rather die than wear that mask again." His wife clarified that he was never formally diagnosed with obstructive sleep apnea.   History of vivid dreaming: Denied. Excessive movement while asleep: Denied. Instances of acting out his dreams: Denied outside of occasional instances where he  will talk in his sleep.   Psychiatric/Behavioral Health History: Depression: Denied. Mr. Broski described his current mood as "good" and denied any previous mental health concerns. However, both his wife and his medical records suggest a history of depression with symptoms of unknown severity. Current or remote suicidal ideation, intent, or plan were denied.  Anxiety: Denied. Mania: Denied. Trauma History: Denied. Visual/auditory hallucinations: Denied. Delusional thoughts: Denied.  Tobacco: Endorsed. He reported commonly smoking up to one pack of cigarettes per day.  Alcohol: He reported very rare alcohol consumption currently. Records suggest that he was a "heavy drinker" for many years up until one year prior. Mr. Picone denied a history of problematic alcohol abuse or dependence.  Recreational drugs: Denied. Caffeine: Denied outside of Dr. Malachi Bonds sodas.   Family History: Problem Relation Age of Onset  . Lung cancer Mother        smoker  . Lung cancer Father        smoker  . Heart Problems Brother        enlarge heart x1 1 brother death due to car accident   . Drug abuse Brother   . Healthy Child    This information was confirmed by Mr. Kosar.  Academic/Vocational History: Highest level of educational attainment: 12 years. 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 No relative weaknesses were identified.  History of developmental delay: Denied. History of grade repetition: Denied. Enrollment in special education courses: Denied. History of LD/ADHD:  Denied.  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 He formally retired this past March.   Evaluation Results:   Behavioral Observations: Mr. Lienhard was accompanied by his wife, arrived to his appointment on time, and was appropriately dressed and groomed. He appeared alert and oriented. Observed gait and station were within normal limits. Gross motor functioning appeared intact upon informal observation and no abnormal movements (e.g., tremors) were noted during interview. Mild tremors were observed during motor-based cognitive tasks. His affect appeared frustrated and mildly angered. This was especially apparent whenever his wife tried to discuss concerns surrounding his functioning after he denied difficulties. He repeatedly expressed his belief that he does not exhibit any difficulties and that the current appointment was not worth attending. Spontaneous speech was fluent and word finding difficulties were not observed during the clinical interview. Sustained attention during the interview was appropriate. Thought processes were coherent, organized, and normal in content. However, insight into his cognitive difficulties appeared poor.   Adequacy of Effort: Shortly following the initiation of testing procedures, Mr. Newfield expressed a desire to discontinue testing, emphasizing that he does not exhibit any cognitive difficulties and that the current testing is "not going to help me because nothing is wrong." Upon expressing this, the psychometrist relayed these concerns to myself. Following a brief conversation, Mr. Issac agreed to complete approximately 30 minutes of testing representing a cognitive screening battery. However, Mr. Wandling ultimately discontinued testing prematurely and left the testing office, stating that he would never return. As such, a comprehensive evaluation was unable to be completed. Tasks that were  completed are believed to be invalid due to limited testing compliance. They are provided at the conclusion of the report for the sake of completion. Across mood-related questionnaires, he answered each item with a "50%" value and did not appear to actually read the symptoms he was endorsing. As such, obtained objective test values are believed to be invalid  and thus not able to be interpreted.  Tables of Scores:   Note: This summary of test scores accompanies the interpretive report and should not be considered in isolation without reference to the appropriate sections in the text. Descriptors are based on appropriate normative data and may be adjusted based on clinical judgment. The terms "impaired" and "within normal limits (WNL)" are used when a more specific level of functioning cannot be determined.       Effort Testing:   DESCRIPTOR       Dot Counting Test: --- --- Below Expectation       Orientation:      Raw Score Percentile   NAB Orientation, Form 1 12/29 --- ---       Cognitive Screening:           Raw Score Percentile   SLUMS: Discontinued --- ---       RBANS, Form A: Standard Score/ Scaled Score Percentile   Total Score --- --- ---  Immediate Memory --- --- ---    List Learning Discontinued --- ---       Intellectual Functioning:           Standard Score Percentile   Test of Premorbid Functioning: 57 4 Well Below Average       Attention/Executive Function:          Trail Making Test (TMT): Raw Score (T Score) Percentile     Part A 115 secs.,  0 errors (16) <1 Exceptionally Low    Part B Discontinued --- Impaired             Language:          Verbal Fluency Test: Raw Score (T Score) Percentile     Phonemic Fluency (FAS) 4 (19) <1 Exceptionally Low    Animal Fluency 2 (10) <1 Exceptionally Low        Mood and Personality:      Raw Score Percentile   PROMIS Depression Questionnaire: 24 --- Moderate  PROMIS Anxiety Questionnaire: 21 --- Moderate   Informed  Consent and Coding/Compliance:   Mr. Hanby was provided with a verbal description of the nature and purpose of the present neuropsychological evaluation. Also reviewed were the foreseeable risks and/or discomforts and benefits of the procedure, limits of confidentiality, and mandatory reporting requirements of this provider. The patient was given the opportunity to ask questions and receive answers about the evaluation. Oral consent to participate was provided by the patient.   This evaluation was conducted by Christia Reading, Ph.D., licensed clinical neuropsychologist. Mr. Vanni completed a comprehensive clinical interview with Dr. Melvyn Novas, billed as one unit [phone], and 30 minutes of cognitive testing and scoring, billed as one unit [phone]. Psychometrist Milana Kidney, B.S., assisted Dr. Melvyn Novas with test administration and scoring procedures. As a separate and discrete service, Dr. Melvyn Novas spent a total of 95 minutes in record review a report writing billed as one unit [phone] and one unit [phone].

## 2019-12-13 ENCOUNTER — Telehealth (INDEPENDENT_AMBULATORY_CARE_PROVIDER_SITE_OTHER): Payer: 59 | Admitting: Psychology

## 2019-12-13 ENCOUNTER — Encounter: Payer: Self-pay | Admitting: Psychology

## 2019-12-13 ENCOUNTER — Other Ambulatory Visit: Payer: Self-pay

## 2019-12-13 DIAGNOSIS — R4189 Other symptoms and signs involving cognitive functions and awareness: Secondary | ICD-10-CM

## 2019-12-13 DIAGNOSIS — R9082 White matter disease, unspecified: Secondary | ICD-10-CM

## 2019-12-13 NOTE — Progress Notes (Signed)
° °  Neuropsychology Feedback Session [place]. [place] Department of Neurology  Reason for Referral:   Dennis Dalton a 64 y.o. right-handed Caucasian male referred by Metta Clines, D.O.,to characterize hiscurrent cognitive functioning and assist with diagnostic clarity and treatment planning in the context of abnormal neuroimaging and concerns regarding cognitive decline.  Feedback:   Please refer to the initial encounter for the full report and recommendations. Shortly following the initiation of testing procedures, Dennis Dalton expressed a desire to discontinue testing, emphasizing that he does not exhibit any cognitive difficulties and that the current testing is "not going to help me because nothing is wrong." Upon expressing this, the psychometrist relayed these concerns to myself. Following a brief conversation, Dennis Dalton agreed to complete approximately 30 minutes of testing representing a cognitive screening battery. However, Dennis Dalton ultimately discontinued testing prematurely and left the testing office. As such, a comprehensive evaluation was unable to be completed. Tasks that were completed are believed to be invalid due to limited testing compliance. Across mood-related questionnaires, he answered each item with a "50%" value and did not appear to actually read the symptoms he was endorsing. While testing was unable to be completed, there is a strong likelihood that he would meet mild vascular neurocognitive disorder diagnostic criteria. Dennis Dalton did report that his wife manages their personal finances and his medications; he was unclear if he could do this independently. Some instrumental activities of daily living (ADL) disruption raises concerns for a major vascular neurocognitive disorder (i.e., vascular dementia) presentation. However, this diagnosis appears premature as his wife denied concerns surrounding leaving her husband at home alone for extended  periods of time (e.g. a weekend) and the extent of suspected cognitive dysfunction could not be objectively examined. Continued medical monitoring will be important moving forward.   Dennis Dalton was accompanied by his wife during the current virtual appointment. Content of the current session focused on the results of his neuropsychological evaluation. Dennis Dalton and his wife were given the opportunity to ask questions and their questions were answered. They were encouraged to reach out should additional questions arise.     Less than 16 minutes were spent conducting the current feedback session with Dennis Dalton.

## 2020-01-26 NOTE — Progress Notes (Signed)
Virtual Visit via Video Note The purpose of this virtual visit is to provide medical care while limiting exposure to the novel coronavirus.    Consent was obtained for video visit:  Yes.   Answered questions that patient had about telehealth interaction:  Yes.   I discussed the limitations, risks, security and privacy concerns of performing an evaluation and management service by telemedicine. I also discussed with the patient that there may be a patient responsible charge related to this service. The patient expressed understanding and agreed to proceed.  Pt location: Home Physician Location: office Name of referring provider:  Eber Hong, MD I connected with Dennis Dalton at patients initiation/request on 01/30/2020 at 10:30 AM EDT by video enabled telemedicine application and verified that I am speaking with the correct person using two identifiers. Pt MRN:  [id number] Pt DOB:  [birth year] Video Participants:  Dennis Dalton   History of Present Illness:  Dennis Dalton is a 64 year old right-handed male who follows up for cognitive deficits.  CTA personally reviewed.  UPDATE: Current medications:  ASA 81mg  daily; donepezil 10mg  daily; B12 shot 11/08/2019 CTA HEAD:  1. No intracranial large vessel occlusion or proximal high-grade arterial stenosis.  2. Calcified plaque within the intracranial internal carotid arteries with no more than mild luminal narrowing. 11/08/2019 CTA NECK:  1. The bilateral common carotid, internal carotid and vertebral arteries are patent within the neck without stenosis.  2. Cervical spondylosis as described and greatest at C5-C6 and C6-C7.  He had neuropsychological testing on 12/06/2019 but discontinued testing prematurely so a definitive impression could not be made.  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  HISTORY: In 2018, hestarted exhibiting cognitive  deficits. He has word-finding difficulty. He has short-term memory issues such as forgetting recent conversations. He frequently repeat questions.He has some trouble recognizing familiar people. One time, he couldn't remember his daughter's name. He has little trouble with everyday tasks. 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 He has not had any problems performing his duties at work. He keeps tabs of multiple keys and knows which keys unlock specific doors. He was at a stop light and briefly zoned out for a couple of seconds.It was believed that he may have had a TIA. He reports that he does not getlost on familiar routes. He does report depressed mood. He does not want to be around people.He has not had any episodes of confusion. No hallucinations or delusions. Symptoms have mildly progressed since onset.  04/21/2017 MRI BRAIN WO: T2 and FLAIR hyperintensities in the subcortical, deep and periventricular regions. Report notes that orientation was perpendicular to the ventricles, raising concern for multiple sclerosis. He followed up with neurology. He underwent LP which did not reveal findings consistent with multiple sclerosis. Neurology believed findings were consistent with chronic small vessel ischemic changes. He was found to have B12 deficiency and started on supplementation. 07/26/2019 LABS:  TSH 0.72, B12 594, RPR nonreactive.  B1 borderline low at 7.  Advised to start thiamine 100mg  daily. 09/11/2019 MRI BRAIN WO:  Extensive patchy and confluent cerebral white matter T2 and FLAIR signal abnormality.  Impression by radiologist included advanced chronic small vessel disease, CADASIL and less likely demyelination.  MRI CERVICAL SPINE WO:  No cord lesions.  Spondylosis with C6-7 due to osteophyte and disc protrusion causing spinal  stenosis causing slight cord deformity and bilateral  foraminal stenosis potentially affecting either C6 nerve.  Education:12th grade Smoker since age 64. 1 ppd No liquor. Drinks 3 to 4 beers a week. He was a heavy drinker for many years up until a year ago. Brothers passed away at young age. Parents no dementia.   Past Medical History: Past Medical History:  Diagnosis Date  . B12 deficiency   . Bilateral hip joint arthritis 09/21/2015  . BPH (benign prostatic hyperplasia)   . Closed compression fracture of thoracic vertebra 09/21/2015  . Contact with and (suspected) exposure to asbestos 11/05/2011  . COPD (chronic obstructive pulmonary disease), mild 09/21/2015  . Dysplastic nevi 12/13/2011  . Esophageal spasm   . GERD (gastroesophageal reflux disease)   . Gilbert's syndrome   . Hyperglycemia 02/24/2017  . Hyperlipidemia, unspecified 03/16/2001   IMO Load 2018 R1.1  . Hyperreflexia 02/23/2019  . Lumbar disc disease 09/21/2015  . Major depressive disorder 04/28/2018  . Myocarditis   . Polycythemia 03/03/2017  . Sensorineural hearing loss   . Tobacco use disorder 04/30/2010    Medications: Outpatient Encounter Medications as of 01/30/2020  Medication Sig  . Apoaequorin (PREVAGEN) 10 MG CAPS Take by mouth.  Marland Kitchen aspirin EC 81 MG tablet Take by mouth.  . cyanocobalamin (,VITAMIN B-12,) 1000 MCG/ML injection Inject into the muscle.  . donepezil (ARICEPT) 10 MG tablet Take 1 tablet (10 mg total) by mouth at bedtime.  . SYRINGE-NEEDLE, DISP, 3 ML (B-D INTEGRA SYRINGE) 25G X 5/8" 3 ML MISC by Does not apply route.  Marland Kitchen UNABLE TO FIND NEURIVA OTC TAKE ONE CAPSULE A DAY   No facility-administered encounter medications on file as of 01/30/2020.    Allergies: No Known Allergies  Family History: Family History  Problem Relation Age of Onset  . Lung cancer Mother        smoker  . Lung cancer Father        smoker  . Heart Problems Brother        enlarge heart x1 1 brother death due to car accident   . Drug abuse Brother   . Healthy Child      Social History: Social History   Socioeconomic History  . Marital status: Married    Spouse name: Not on file  . Number of children: 2  . Years of education: 65  . Highest education level: High school graduate  Occupational History  . Occupation: Retired    Comment: Education administrator  Tobacco Use  . Smoking status: Current Every Day Smoker    Packs/day: 1.00  . Smokeless tobacco: Never Used  Substance and Sexual Activity  . Alcohol use: Not Currently    Comment: Very rarely  . Drug use: Never  . Sexual activity: Not on file  Other Topics Concern  . Not on file  Social History Narrative   Pt is married lives with spouse   He has 5 children total 3 are biological children   Right handed   Drinks soda 4-5 a day, no coffee, no tea   Social Determinants of Radio broadcast assistant Strain:   . Difficulty of Paying Living Expenses:   Food Insecurity:   . Worried About Charity fundraiser in the Last Year:   . Arboriculturist in the Last Year:   Transportation Needs:   . Film/video editor (Medical):   Marland Kitchen Lack of Transportation (Non-Medical):   Physical Activity:   . Days of Exercise  per Week:   . Minutes of Exercise per Session:   Stress:   . Feeling of Stress :   Social Connections:   . Frequency of Communication with Friends and Family:   . Frequency of Social Gatherings with Friends and Family:   . Attends Religious Services:   . Active Member of Clubs or Organizations:   . Attends Archivist Meetings:   Marland Kitchen Marital Status:   Intimate Partner Violence:   . Fear of Current or Ex-Partner:   . Emotionally Abused:   Marland Kitchen Physically Abused:   . Sexually Abused:     Observations/Objective:   Height 5\' 10"  (1.778 m), weight 145 lb (65.8 kg). No acute distress.  Alert and oriented.  Speech fluent and not dysarthric.  Language intact.  Eyes orthophoric on primary gaze.  Face symmetric.  Assessment and Plan:   1.  Abnormal cerebral white matter.   Severe chronic small vessel disease.  Presentation on MRI raised possibility of CADASIL.  Genetic testing too expensive and not covered by insurance, so deferred at this time. 2.  Mild vascular neurocognitive disorder 3.  B12 deficiency  1.  Aricept 10mg  at bedtime 2.  Management of stroke risk factors:  ASA 81mg  daily  Blood pressure/glycemic/cholesterol control  Smoking cessaton  Mediterranean diet  Routine exercise 3.  Follow up in 9 months.  Follow Up Instructions:    -I discussed the assessment and treatment plan with the patient. The patient was provided an opportunity to ask questions and all were answered. The patient agreed with the plan and demonstrated an understanding of the instructions.   The patient was advised to call back or seek an in-person evaluation if the symptoms worsen or if the condition fails to improve as anticipated.    Dudley Major, DO

## 2020-01-30 ENCOUNTER — Encounter: Payer: Self-pay | Admitting: Neurology

## 2020-01-30 ENCOUNTER — Telehealth (INDEPENDENT_AMBULATORY_CARE_PROVIDER_SITE_OTHER): Payer: 59 | Admitting: Neurology

## 2020-01-30 ENCOUNTER — Other Ambulatory Visit: Payer: Self-pay

## 2020-01-30 VITALS — Ht 70.0 in | Wt 145.0 lb

## 2020-01-30 DIAGNOSIS — F17200 Nicotine dependence, unspecified, uncomplicated: Secondary | ICD-10-CM

## 2020-01-30 DIAGNOSIS — E538 Deficiency of other specified B group vitamins: Secondary | ICD-10-CM | POA: Diagnosis not present

## 2020-01-30 DIAGNOSIS — F0150 Vascular dementia without behavioral disturbance: Secondary | ICD-10-CM | POA: Diagnosis not present

## 2020-01-30 DIAGNOSIS — F01A0 Vascular dementia, mild, without behavioral disturbance, psychotic disturbance, mood disturbance, and anxiety: Secondary | ICD-10-CM

## 2020-01-30 NOTE — Patient Instructions (Signed)
1.  Continue donepezil 10mg  at bedtime 2.  Continue aspirin 81mg  daily 3.  Blood pressure control and cholesterol control important. 4.  Try to quit smoking 5.  Mediterranean diet (see below) 6.  Follow up in 9 months   Mediterranean Diet A Mediterranean diet refers to food and lifestyle choices that are based on the traditions of countries located on the [employer]. This way of eating has been shown to help prevent certain conditions and improve outcomes for people who have chronic diseases, like kidney disease and heart disease. What are tips for following this plan? Lifestyle  Cook and eat meals together with your family, when possible.  Drink enough fluid to keep your urine clear or pale yellow.  Be physically active every day. This includes: ? Aerobic exercise like running or swimming. ? Leisure activities like gardening, walking, or housework.  Get 7-8 hours of sleep each night.  If recommended by your health care provider, drink red wine in moderation. This means 1 glass a day for nonpregnant women and 2 glasses a day for men. A glass of wine equals 5 oz (150 mL). Reading food labels   Check the serving size of packaged foods. For foods such as rice and pasta, the serving size refers to the amount of cooked product, not dry.  Check the total fat in packaged foods. Avoid foods that have saturated fat or trans fats.  Check the ingredients list for added sugars, such as corn syrup. Shopping  At the grocery store, buy most of your food from the areas near the walls of the store. This includes: ? Fresh fruits and vegetables (produce). ? Grains, beans, nuts, and seeds. Some of these may be available in unpackaged forms or large amounts (in bulk). ? Fresh seafood. ? Poultry and eggs. ? Low-fat dairy products.  Buy whole ingredients instead of prepackaged foods.  Buy fresh fruits and vegetables in-season from local farmers markets.  Buy frozen fruits and vegetables  in resealable bags.  If you do not have access to quality fresh seafood, buy precooked frozen shrimp or canned fish, such as tuna, salmon, or sardines.  Buy small amounts of raw or cooked vegetables, salads, or olives from the deli or salad bar at your store.  Stock your pantry so you always have certain foods on hand, such as olive oil, canned tuna, canned tomatoes, rice, pasta, and beans. Cooking  Cook foods with extra-virgin olive oil instead of using butter or other vegetable oils.  Have meat as a side dish, and have vegetables or grains as your main dish. This means having meat in small portions or adding small amounts of meat to foods like pasta or stew.  Use beans or vegetables instead of meat in common dishes like chili or lasagna.  Experiment with different cooking methods. Try roasting or broiling vegetables instead of steaming or sauteing them.  Add frozen vegetables to soups, stews, pasta, or rice.  Add nuts or seeds for added healthy fat at each meal. You can add these to yogurt, salads, or vegetable dishes.  Marinate fish or vegetables using olive oil, lemon juice, garlic, and fresh herbs. Meal planning   Plan to eat 1 vegetarian meal one day each week. Try to work up to 2 vegetarian meals, if possible.  Eat seafood 2 or more times a week.  Have healthy snacks readily available, such as: ? Vegetable sticks with hummus. ? [place] yogurt. ? Fruit and nut trail mix.  Eat balanced meals throughout  the week. This includes: ? Fruit: 2-3 servings a day ? Vegetables: 4-5 servings a day ? Low-fat dairy: 2 servings a day ? Fish, poultry, or lean meat: 1 serving a day ? Beans and legumes: 2 or more servings a week ? Nuts and seeds: 1-2 servings a day ? Whole grains: 6-8 servings a day ? Extra-virgin olive oil: 3-4 servings a day  Limit red meat and sweets to only a few servings a month What are my food choices?  Mediterranean diet ? Recommended  Grains: Whole-grain  pasta. Brown rice. Bulgar wheat. Polenta. Couscous. Whole-wheat bread. [place].  Vegetables: Artichokes. Beets. Broccoli. Cabbage. Carrots. Eggplant. Green beans. Chard. Kale. Spinach. Onions. Leeks. Peas. Squash. Tomatoes. Peppers. Radishes.  Fruits: Apples. Apricots. Avocado. Berries. Bananas. Cherries. Dates. Figs. Grapes. Lemons. Melon. Oranges. Peaches. Plums. Pomegranate.  Meats and other protein foods: Beans. Almonds. Sunflower seeds. Pine nuts. Peanuts. [place]. Salmon. Scallops. Shrimp. [place]. Tilapia. Clams. Oysters. Eggs.  Dairy: Low-fat milk. Cheese. Greek yogurt.  Beverages: Water. Red wine. Herbal tea.  Fats and oils: Extra virgin olive oil. Avocado oil. Grape seed oil.  Sweets and desserts: [place] yogurt with honey. Baked apples. Poached pears. Trail mix.  Seasoning and other foods: Basil. Cilantro. Coriander. Cumin. Mint. Parsley. Sage. Rosemary. Tarragon. Garlic. Oregano. Thyme. Pepper. Balsalmic vinegar. Tahini. Hummus. Tomato sauce. Olives. Mushrooms. ? Limit these  Grains: Prepackaged pasta or rice dishes. Prepackaged cereal with added sugar.  Vegetables: Deep fried potatoes (french fries).  Fruits: Fruit canned in syrup.  Meats and other protein foods: Beef. Pork. Lamb. Poultry with skin. Hot dogs. [place] Salines.  Dairy: Ice cream. Sour cream. Whole milk.  Beverages: Juice. Sugar-sweetened soft drinks. Beer. Liquor and spirits.  Fats and oils: Butter. Canola oil. Vegetable oil. Beef fat (tallow). Lard.  Sweets and desserts: Cookies. Cakes. Pies. Candy.  Seasoning and other foods: Mayonnaise. Premade sauces and marinades. The items listed may not be a complete list. Talk with your dietitian about what dietary choices are right for you. Summary  The Mediterranean diet includes both food and lifestyle choices.  Eat a variety of fresh fruits and vegetables, beans, nuts, seeds, and whole grains.  Limit the amount of red meat and sweets that you eat.  Talk with  your health care provider about whether it is safe for you to drink red wine in moderation. This means 1 glass a day for nonpregnant women and 2 glasses a day for men. A glass of wine equals 5 oz (150 mL). This information is not intended to replace advice given to you by your health care provider. Make sure you discuss any questions you have with your health care provider. Document Revised: 04/10/2016 Document Reviewed: 04/03/2016 Elsevier Patient Education  [place].

## 2020-03-28 ENCOUNTER — Other Ambulatory Visit: Payer: Self-pay | Admitting: Neurology

## 2020-05-28 ENCOUNTER — Encounter: Payer: Self-pay | Admitting: Neurology

## 2020-05-28 ENCOUNTER — Other Ambulatory Visit: Payer: Self-pay | Admitting: Neurology

## 2020-05-28 ENCOUNTER — Ambulatory Visit (INDEPENDENT_AMBULATORY_CARE_PROVIDER_SITE_OTHER): Payer: 59 | Admitting: Neurology

## 2020-05-28 ENCOUNTER — Other Ambulatory Visit: Payer: Self-pay

## 2020-05-28 VITALS — BP 139/82 | HR 84 | Ht 68.0 in | Wt 142.0 lb

## 2020-05-28 DIAGNOSIS — F0391 Unspecified dementia with behavioral disturbance: Secondary | ICD-10-CM

## 2020-05-28 MED ORDER — ESCITALOPRAM OXALATE 10 MG PO TABS: 10.0000 mg | ORAL_TABLET | Freq: Every day | ORAL | 5 refills | Status: DC

## 2020-05-28 NOTE — Progress Notes (Signed)
NEUROLOGY FOLLOW UP OFFICE NOTE  Dennis Dalton [id number]  HISTORY OF PRESENT ILLNESS: Dennis Dalton is a 64 year old right-handed male whofollows up for cognitive disorder.    UPDATE: Current medications:  ASA 81mg  daily; donepezil 10mg  daily; B12 shot monthly.  Several weeks ago, he visited his son but did not recognize him.  [place]  He went in a store to use the bathroom but he never came out and was found on the other side of the building.  He woke up one night and sat at the end of the bed and said he felt confused and wondered if he had a stroke.  He is easily agitated.  He drives but only locally.  No issues.     HISTORY: In 2018, hestarted exhibiting cognitive deficits. He has word-finding difficulty. He has short-term memory issues such as forgetting recent conversations. He frequently repeat questions.He has some trouble recognizing familiar people. One time, he couldn't remember his daughter's name. He has little trouble with everyday tasks. 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 He has not had any problems performing his duties at work. He keeps tabs of multiple keys and knows which keys unlock specific doors. He was at a stop light and briefly zoned out for a couple of seconds.It was believed that he may have had a TIA. He reports that he does not getlost on familiar routes. He does report depressed mood. He does not want to be around people.He has not had any episodes of confusion. No hallucinations or delusions. Symptoms have mildly progressed since onset.  04/21/2017 MRI BRAIN WO: T2 and FLAIR hyperintensities in the subcortical, deep and periventricular regions. Report notes that orientation was perpendicular to the ventricles, raising concern for multiple sclerosis. He followed up with neurology. He underwent LP which did not reveal  findings consistent with multiple sclerosis. Neurology believed findings were consistent with chronic small vessel ischemic changes. He was found to have B12 deficiency and started on supplementation. 07/26/2019 LABS: TSH 0.72, B12 594, RPR nonreactive. B1 borderline low at 7. Advised to start thiamine 100mg  daily. 09/11/2019 MRI BRAIN WO: Extensive patchy and confluent cerebral white matter T2 and FLAIR signal abnormality. Impression by radiologist included advanced chronic small vessel disease, CADASIL and less likely demyelination. MRI CERVICAL SPINE WO: No cord lesions. 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 11/08/2019 CTA HEAD:  1. No intracranial large vessel occlusion or proximal high-grade arterial stenosis.  2. Calcified plaque within the intracranial internal carotid arteries with no more than mild luminal narrowing. 11/08/2019 CTA NECK:  1. The bilateral common carotid, internal carotid and vertebral arteries are patent within the neck without stenosis.  2. Cervical spondylosis as described and greatest at C5-C6 and C6-C7.  He had neuropsychological testing on 12/06/2019 but discontinued testing prematurely so a definitive impression could not be made.  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  Education:12th grade Smoker since age 40. 1 ppd No longer drinks alcohol. He was a heavy drinker for many years up until a year ago. Brothers passed away at young age. Parents no dementia.  PAST MEDICAL HISTORY: Past Medical History:  Diagnosis Date  . B12 deficiency   . Bilateral  hip joint arthritis 09/21/2015  . BPH (benign prostatic hyperplasia)   . Closed compression fracture of thoracic vertebra 09/21/2015  . Contact with and (suspected)  exposure to asbestos 11/05/2011  . COPD (chronic obstructive pulmonary disease), mild 09/21/2015  . Dysplastic nevi 12/13/2011  . Esophageal spasm   . GERD (gastroesophageal reflux disease)   . Gilbert's syndrome   . Hyperglycemia 02/24/2017  . Hyperlipidemia, unspecified 03/16/2001   IMO Load 2018 R1.1  . Hyperreflexia 02/23/2019  . Lumbar disc disease 09/21/2015  . Major depressive disorder 04/28/2018  . Myocarditis   . Polycythemia 03/03/2017  . Sensorineural hearing loss   . Tobacco use disorder 04/30/2010    MEDICATIONS: Current Outpatient Medications on File Prior to Visit  Medication Sig Dispense Refill  . Apoaequorin (PREVAGEN) 10 MG CAPS Take by mouth.    Marland Kitchen aspirin EC 81 MG tablet Take by mouth.    . cyanocobalamin (,VITAMIN B-12,) 1000 MCG/ML injection Inject into the muscle.    . donepezil (ARICEPT) 10 MG tablet TAKE 1 TABLET BY MOUTH EVERYDAY AT BEDTIME 90 tablet 1  . SYRINGE-NEEDLE, DISP, 3 ML (B-D INTEGRA SYRINGE) 25G X 5/8" 3 ML MISC by Does not apply route.    Marland Kitchen UNABLE TO FIND NEURIVA OTC TAKE ONE CAPSULE A DAY     No current facility-administered medications on file prior to visit.    ALLERGIES: No Known Allergies  FAMILY HISTORY: Family History  Problem Relation Age of Onset  . Lung cancer Mother        smoker  . Lung cancer Father        smoker  . Heart Problems Brother        enlarge heart x1 1 brother death due to car accident   . Drug abuse Brother   . Healthy Child     SOCIAL HISTORY: Social History   Socioeconomic History  . Marital status: Married    Spouse name: Not on file  . Number of children: 2  . Years of education: 72  . Highest education level: High school graduate  Occupational History  . Occupation: Retired    Comment: Education administrator  Tobacco Use  . Smoking status: Current Every Day Smoker    Packs/day: 1.00  . Smokeless tobacco: Never Used  Vaping Use  . Vaping Use: Never used  Substance and Sexual Activity  . Alcohol use:  Not Currently    Comment: Very rarely  . Drug use: Never  . Sexual activity: Not on file  Other Topics Concern  . Not on file  Social History Narrative   Pt is married lives with spouse   He has 5 children total 3 are biological children   Right handed   Drinks soda 4-5 a day, no coffee, no tea   Social Determinants of Radio broadcast assistant Strain:   . Difficulty of Paying Living Expenses: Not on file  Food Insecurity:   . Worried About Charity fundraiser in the Last Year: Not on file  . Ran Out of Food in the Last Year: Not on file  Transportation Needs:   . Lack of Transportation (Medical): Not on file  . Lack of Transportation (Non-Medical): Not on file  Physical Activity:   . Days of Exercise per Week: Not on file  . Minutes of Exercise per Session: Not on file  Stress:   . Feeling of Stress : Not on file  Social Connections:   . Frequency of Communication with Friends and  Family: Not on file  . Frequency of Social Gatherings with Friends and Family: Not on file  . Attends Religious Services: Not on file  . Active Member of Clubs or Organizations: Not on file  . Attends Archivist Meetings: Not on file  . Marital Status: Not on file  Intimate Partner Violence:   . Fear of Current or Ex-Partner: Not on file  . Emotionally Abused: Not on file  . Physically Abused: Not on file  . Sexually Abused: Not on file    PHYSICAL EXAM: Blood pressure 139/82, pulse 84, height 5\' 8"  (1.727 m), weight 142 lb (64.4 kg), SpO2 97 %. General: No acute distress.  Patient appears well-groomed.   Head:  Normocephalic/atraumatic Eyes:  Fundi examined but not visualized Neck: supple, no paraspinal tenderness, full range of motion Heart:  Regular rate and rhythm Lungs:  Clear to auscultation bilaterally Back: No paraspinal tenderness Neurological Exam: alert and oriented to person, place, and time. Recent and remote memory intact, fund of knowledge intact.  Speech fluent  and not dysarthric, language intact.  CN II-XII intact. Bulk and tone normal, muscle strength 5/5 throughout.  Sensation to light touch, temperature and vibration intact.  Deep tendon reflexes 2+ throughout.  Finger to nose and heel to shin testing intact.  Gait normal, Romberg negative.  IMPRESSION: 1. Major vascular neurocognitive disorder 2.  Severe cerebrovascular disease.  Presentation on MRI raised possibility of CADASIL.  Genetic testing too expensive and not covered by insurance, so deferred at this time.  PLAN: 1.  Will prescribe escitalopram 10mg  daily to help with mood.   2.  Continue donepezil 10mg  at bedtime and ASA 81mg  daily. 3.  Provided wife with caregiver resources. 4.  Advised to limit driving to local, daylight and off [address]. 5.  Follow up in 6 months.  Metta Clines, DO  CC: Eber Hong, MD

## 2020-05-28 NOTE — Patient Instructions (Signed)
1.  Start escitalopram 10mg  daily 2.  Continue donepezil 10mg  at bedtime and aspirin 81mg  daily 3.  Follow up in 6 months.

## 2020-06-20 ENCOUNTER — Other Ambulatory Visit: Payer: Self-pay | Admitting: Neurology

## 2020-09-07 ENCOUNTER — Ambulatory Visit: Payer: 59 | Admitting: Neurology

## 2020-10-30 ENCOUNTER — Ambulatory Visit: Payer: 59 | Admitting: Neurology

## 2020-11-19 ENCOUNTER — Other Ambulatory Visit: Payer: Self-pay | Admitting: Neurology

## 2020-11-26 NOTE — Progress Notes (Signed)
NEUROLOGY FOLLOW UP OFFICE NOTE  Dennis Dalton [id number]  Assessment/Plan:   1.  Major vascular neurocognitive disorder 2.  Severe cerebrovascular disease.  Presentation on MRI raised possibility of CADASIL. Genetic testing too expensive and not covered by insurance and wouldn't change treatment, so deferred at this time.  He is a cigarette smoker as well. 3.  Depression  I was unable to examine the patient.  Patient became upset and walked out of the office - his spouse went to get him.  I will have our social worker reach out to his wife to discuss options for any possible assistance regarding this difficult situation.  1.  Donepezil 10mg  at bedtime 2.  ASA 81mg  daily 3.  Will increase escitalopram to 20mg  daily  Subjective:  Dennis Dalton is a 66 year old right-handed male whofollows up for major vascular neurocognitive disorder  UPDATE: Current medications: ASA 81mg  daily; donepezil 10mg  daily; escitalopram 10mg  QD (ifrequently), B12 shot monthly.  Started escitalopram in October.     Several weeks ago, he visited his son but did not recognize him.  [place]  He went in a store to use the bathroom but he never came out and was found on the other side of the building.  He woke up one night and sat at the end of the bed and said he felt confused and wondered if he had a stroke.  He is easily agitated.  He drives but only locally.  No issues.     HISTORY: In 2018, hestarted exhibiting cognitive deficits. He has word-finding difficulty. He has short-term memory issues such as forgetting recent conversations. He frequently repeat questions.He has some trouble recognizing familiar people. One time, he couldn't remember his daughter's name. He has little trouble with everyday tasks. 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 He has not had any problems performing  his duties at work. He keeps tabs of multiple keys and knows which keys unlock specific doors. He was at a stop light and briefly zoned out for a couple of seconds.It was believed that he may have had a TIA. He reports that he does not getlost on familiar routes. He does report depressed mood. He does not want to be around people.He has not had any episodes of confusion. No hallucinations or delusions. Symptoms have mildly progressed since onset.  08/28/2018MRI BRAIN WO:T2 and FLAIR hyperintensities in the subcortical, deep and periventricular regions. Report notes that orientation was perpendicular to the ventricles, raising concern for multiple sclerosis. He followed up with neurology. He underwent LP which did not reveal findings consistent with multiple sclerosis. Neurology believed findings were consistent with chronic small vessel ischemic changes. He was found to have B12 deficiency and started on supplementation. 07/26/2019 LABS: TSH 0.72, B12 594, RPR nonreactive. B1 borderline low at 7. Advised to start thiamine 100mg  daily. 09/11/2019 MRI BRAIN WO: Extensive patchy and confluent cerebral white matter T2 and FLAIR signal abnormality. Impression by radiologist included advanced chronic small vessel disease, CADASIL and less likely demyelination. MRI CERVICAL SPINE WO: No cord lesions. 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 11/08/2019 CTA HEAD:1. No intracranial large vessel occlusion or proximal high-grade arterial stenosis. 2. Calcified plaque within the intracranial internal carotid arteries with no more than mild luminal narrowing. 11/08/2019 CTA  NECK:1. The bilateral common carotid, internal carotid and vertebral arteries are patent within the neck without stenosis. 2. Cervical spondylosis as described and greatest at  C5-C6 and C6-C7.  He had neuropsychological testing on 12/06/2019 but discontinued testing prematurely so a definitive impression could not be made. 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  Education:12th grade Smoker since age 72. 1 ppd No longer drinks alcohol. He was a heavy drinker for many years up until a year ago. Brothers passed away at young age. Parents no dementia.  PAST MEDICAL HISTORY: Past Medical History:  Diagnosis Date  . B12 deficiency   . Bilateral hip joint arthritis 09/21/2015  . BPH (benign prostatic hyperplasia)   . Closed compression fracture of thoracic vertebra 09/21/2015  . Contact with and (suspected) exposure to asbestos 11/05/2011  . COPD (chronic obstructive pulmonary disease), mild 09/21/2015  . Dysplastic nevi 12/13/2011  . Esophageal spasm   . GERD (gastroesophageal reflux disease)   . Gilbert's syndrome   . Hyperglycemia 02/24/2017  . Hyperlipidemia, unspecified 03/16/2001   IMO Load 2018 R1.1  . Hyperreflexia 02/23/2019  . Lumbar disc disease 09/21/2015  . Major depressive disorder 04/28/2018  . Myocarditis   . Polycythemia 03/03/2017  . Sensorineural hearing loss   . Tobacco use disorder 04/30/2010    MEDICATIONS: Current Outpatient Medications on File Prior to Visit  Medication Sig Dispense Refill  . Apoaequorin (PREVAGEN) 10 MG CAPS Take by mouth.    Marland Kitchen aspirin EC 81 MG tablet Take by mouth.    Marland Kitchen b complex vitamins capsule Take 1 capsule by mouth daily.    . cyanocobalamin (,VITAMIN B-12,) 1000 MCG/ML injection Inject into the muscle.    . donepezil (ARICEPT) 10 MG tablet TAKE 1 TABLET BY MOUTH EVERYDAY AT BEDTIME 90 tablet 1  . escitalopram (LEXAPRO) 10 MG tablet TAKE 1 TABLET BY MOUTH EVERY DAY 90 tablet 2  . SYRINGE-NEEDLE, DISP, 3 ML (B-D INTEGRA SYRINGE) 25G X 5/8" 3 ML MISC by Does not apply route.    Marland Kitchen UNABLE TO FIND NEURIVA OTC TAKE ONE  CAPSULE A DAY     No current facility-administered medications on file prior to visit.    ALLERGIES: No Known Allergies  FAMILY HISTORY: Family History  Problem Relation Age of Onset  . Lung cancer Mother        smoker  . Lung cancer Father        smoker  . Heart Problems Brother        enlarge heart x1 1 brother death due to car accident   . Drug abuse Brother   . Healthy Child       Objective:  There were no vitals taken for this visit. General: No acute distress but irritable.  Patient appears well-groomed.        Metta Clines, DO  CC: Eber Hong, MD

## 2020-11-27 ENCOUNTER — Other Ambulatory Visit: Payer: Self-pay

## 2020-11-27 ENCOUNTER — Ambulatory Visit (INDEPENDENT_AMBULATORY_CARE_PROVIDER_SITE_OTHER): Payer: 59 | Admitting: Neurology

## 2020-11-27 ENCOUNTER — Encounter: Payer: Self-pay | Admitting: Neurology

## 2020-11-27 DIAGNOSIS — F17200 Nicotine dependence, unspecified, uncomplicated: Secondary | ICD-10-CM | POA: Diagnosis not present

## 2020-11-27 DIAGNOSIS — F0391 Unspecified dementia with behavioral disturbance: Secondary | ICD-10-CM | POA: Diagnosis not present

## 2020-11-27 DIAGNOSIS — F32A Depression, unspecified: Secondary | ICD-10-CM

## 2020-11-27 MED ORDER — ESCITALOPRAM OXALATE 20 MG PO TABS: 20.0000 mg | ORAL_TABLET | Freq: Every day | ORAL | 5 refills | Status: DC

## 2020-11-30 ENCOUNTER — Telehealth: Payer: Self-pay | Admitting: Neurology

## 2020-11-30 ENCOUNTER — Other Ambulatory Visit: Payer: Self-pay | Admitting: Neurology

## 2020-11-30 MED ORDER — ESCITALOPRAM OXALATE 20 MG PO TABS: 20.0000 mg | ORAL_TABLET | Freq: Every day | ORAL | 1 refills | Status: DC

## 2020-11-30 MED ORDER — DONEPEZIL HCL 10 MG PO TABS: ORAL_TABLET | ORAL | 1 refills | Status: DC

## 2020-11-30 NOTE — Telephone Encounter (Signed)
Patient's wife called in stating Dr. Tomi Likens had sent in a few prescriptions when he was here on Tuesday, but [facility] does not have them.

## 2020-11-30 NOTE — Telephone Encounter (Signed)
Patient's wife called in wanting Misty to call her cell phone [phone] not the home phone.

## 2021-01-24 IMAGING — CT CT ANGIO NECK
1 of 4 series · 2 of 16 positions shown · IV contrast (iopamidol)
Comparison: Brain MRI 09/11/2019

CLINICAL DATA: Neuro cognitive disorder. White matter abnormality
on MRI of brain. Hyper reflexia.

EXAM:
CT ANGIOGRAPHY HEAD AND NECK
TECHNIQUE: Multidetector CT imaging of the head and neck was performed using
the standard protocol during bolus administration of intravenous
contrast. Multiplanar CT image reconstructions and MIPs were
obtained to evaluate the vascular anatomy. Carotid stenosis
measurements (when applicable) are obtained utilizing NASCET
criteria, using the distal internal carotid diameter as the
denominator.
CONTRAST:  75mL F8W0OH-XQH IOPAMIDOL (F8W0OH-XQH) INJECTION 76%

[Series 7: head/neck angio · axial · 0.57mm/px · z∈[-292,-168]mm · 2 of 188 slices shown]
[im 63/188  soft-tissue]
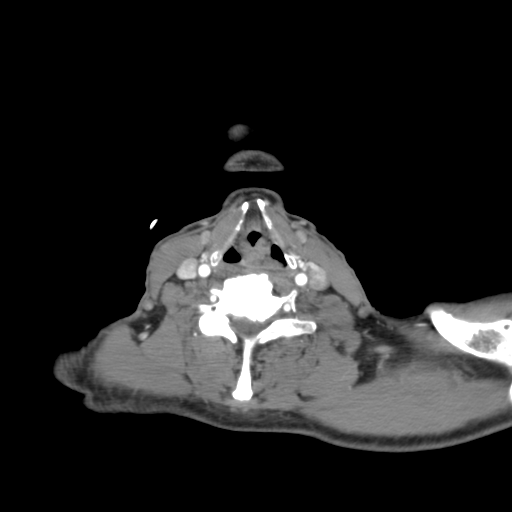
[im 125/188  bone]
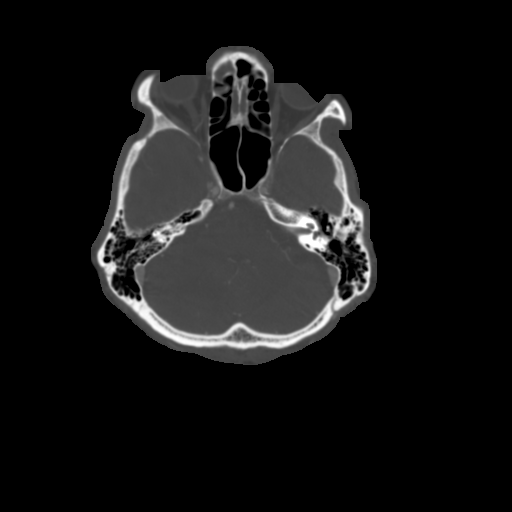

[2 of 16 positions shown; findings below may reference images not displayed]

FINDINGS: CT HEAD FINDINGS

Brain: There is no evidence of acute intracranial hemorrhage,
intracranial mass, midline shift or extra-axial fluid collection.No
demarcated cortical infarction. There is extensive patchy and
confluent hypodensity within the cerebral white matter which is
advanced for age. Cerebral volume is normal for age.

Vascular: Reported below.

Skull: Normal. Negative for fracture or focal lesion.

Sinuses: Mild right frontal and bilateral ethmoid sinus mucosal
thickening. Moderate-sized right maxillary sphenoid sinus mucous
retention cyst. No significant mastoid effusion.

Orbits: Visualized orbits demonstrate no acute abnormality.

Review of the MIP images confirms the above findings

CTA NECK FINDINGS

Aortic arch: Standard aortic branching. The visualized aortic arch
is unremarkable. No innominate or proximal subclavian artery
stenosis.

Right carotid system: CCA and ICA patent within the neck without
stenosis.

Left carotid system: CCA and ICA patent within the neck without
stenosis.

Vertebral arteries: Codominant and patent within the neck without
stenosis.

Skeleton: No acute bony abnormality or aggressive osseous lesion.
Cervical spondylosis. Most notably, at C5-C6 and C6-C7 are posterior
disc osteophytes with uncovertebral and facet hypertrophy. Bilateral
bony neural foraminal narrowing and suspected at least
mild-to-moderate canal stenosis at these levels. Reversal of
expected cervical lordosis. Partially visualized thoracic
dextrocurvature.

Other neck: No neck mass or cervical lymphadenopathy. Subcentimeter
right thyroid lobe nodule not meeting consensus criteria for
ultrasound follow-up.

Upper chest: No consolidation within the imaged lung apices.

Review of the MIP images confirms the above findings

CTA HEAD FINDINGS

Anterior circulation:

The intracranial internal carotid arteries are patent bilaterally.
Scattered calcified plaque within these vessels with no more than
mild luminal narrowing. The M1 middle cerebral arteries are patent
without significant stenosis. No M2 proximal branch occlusion or
high-grade proximal stenosis is identified. The anterior cerebral
arteries are patent without high-grade proximal stenosis. No
intracranial aneurysm is identified.

Posterior circulation:

The intracranial vertebral arteries are patent without significant
stenosis, as is the basilar artery. The bilateral posterior cerebral
arteries are patent without significant proximal stenosis. Posterior
communicating arteries are present bilaterally.

Venous sinuses: Within limitations of contrast timing, no convincing
thrombus.

Anatomic variants: None significant

Review of the MIP images confirms the above findings
IMPRESSION: CT head:

1. No evidence of acute intracranial abnormality.
2. Redemonstrated nonspecific, extensive and significantly advanced
for age cerebral white matter disease.
3. Mild ethmoid and right frontal sinus mucosal thickening. Right
sphenoid sinus mucous retention cyst.

CTA neck:

1. The bilateral common carotid, internal carotid and vertebral
arteries are patent within the neck without stenosis.
2. Cervical spondylosis as described and greatest at C5-C6 and
C6-C7.

CTA head:

1. No intracranial large vessel occlusion or proximal high-grade
arterial stenosis.
2. Calcified plaque within the intracranial internal carotid
arteries with no more than mild luminal narrowing.

## 2021-01-25 ENCOUNTER — Telehealth: Payer: Self-pay | Admitting: Neurology

## 2021-01-25 NOTE — Telephone Encounter (Signed)
Pt wife Dennis Dalton called in and said Dennis Dalton is more agitated with his dementia. She was curious if he could be put on another medicine or add something else to help him. She would like for you to call her back.

## 2021-01-25 NOTE — Telephone Encounter (Signed)
Advised to wife, she will check at his PCP and if not will get one done there and fax results.

## 2021-01-25 NOTE — Telephone Encounter (Signed)
Please advise on any recommendations, thanks

## 2021-01-25 NOTE — Telephone Encounter (Signed)
I would like to start him on seroquel.  Has he had any EKGs performed at his PCP's office over the past year?  This medication may potentially cause heart conduction problems, so I always want to check a baseline EKG.  If it was done, I would like his PCP's office to fax to [place] for me to review and we can start a medication.  Otherwise, I would request having one done (either at his PCP's office or with a local cardiology office in [place]) as we are a neurology practice who does not perform EKGs.

## 2021-02-04 MED ORDER — QUETIAPINE FUMARATE 25 MG PO TABS: 25.0000 mg | ORAL_TABLET | Freq: Two times a day (BID) | ORAL | 5 refills | Status: DC

## 2021-02-04 MED ORDER — QUETIAPINE FUMARATE 25 MG PO TABS: 25.0000 mg | ORAL_TABLET | Freq: Three times a day (TID) | ORAL | 5 refills | Status: DC

## 2021-02-04 NOTE — Addendum Note (Signed)
Addended by: Venetia Night on: 02/04/2021 01:18 PM   Modules accepted: Orders

## 2021-02-04 NOTE — Telephone Encounter (Signed)
Script sent to the pharmacy, Advised Pt wife Bethena Roys.

## 2021-02-04 NOTE — Telephone Encounter (Signed)
Telephone call to Pt wife Bethena Roys, One the EKG is reviewed by [facility] we will give them a call with the next steps

## 2021-02-04 NOTE — Telephone Encounter (Signed)
Patient's wife Bethena Roys called and said the patient had an EKG on Friday, 02/01/21 at [facility] by Dr. Eber Hong.   She'd like to know if Dr. Tomi Likens has reviewed the results so the patient can start his new medicine?

## 2021-02-27 ENCOUNTER — Other Ambulatory Visit: Payer: Self-pay | Admitting: Neurology

## 2021-04-05 ENCOUNTER — Other Ambulatory Visit: Payer: Self-pay | Admitting: Neurology

## 2021-06-01 ENCOUNTER — Other Ambulatory Visit: Payer: Self-pay | Admitting: Neurology

## 2021-06-03 NOTE — Progress Notes (Signed)
Virtual Visit via Video Note The purpose of this virtual visit is to provide medical care while limiting exposure to the novel coronavirus.    Consent was obtained for video visit:  Yes.   Answered questions that patient had about telehealth interaction:  Yes.   I discussed the limitations, risks, security and privacy concerns of performing an evaluation and management service by telemedicine. I also discussed with the patient that there may be a patient responsible charge related to this service. The patient expressed understanding and agreed to proceed.  Pt location: Home Physician Location: office Name of referring provider:  Eber Hong, MD I connected with Dennis Dalton at patients initiation/request on 06/04/2021 at  1:30 PM EDT by video enabled telemedicine application and verified that I am speaking with the correct person using two identifiers. Pt MRN:  [id number] Pt DOB:  May 23, 1956 Video Participants:  Patient's wife; Blaike Vickers  Assessment and Plan:   1  Major vascular neurocognitive disorder 2  Severe cerebrovascular disease 3  Depression  Overall improved.  Donepezil 10mg  at bedtime Escitalopram 20mg  daily Quetiapine 25mg  twice daily ASA 81mg  daily and treatment of stroke risk factors as managed by PCP At this time, he does not plan to drive.  However, if he changes his mind, I told him that he would first need a formal driving evaluation conducted by an occupational therapist. Follow up 6 months.  History of Present Illness:  Dennis Dalton is a 65 year old right-handed male who follows up for major vascular neurocognitive disorder   UPDATE: Current medications:  ASA 81mg  daily; donepezil 10mg  daily; escitalopram 20mg  QD, quetiapine 25mg  BID   Escitalopram was increased to 20mg  in April.  Due to continued agitation, he was started on quetiapine in June.  EKG showed QT/QTc 420/443 ms. Overall, his mood and irritability has improved a bit.  He is still not  driving.     HISTORY: In 2018, he started exhibiting cognitive deficits.  He has word-finding difficulty.  He has short-term memory issues such as forgetting recent conversations.  He frequently repeat questions.  He has some trouble recognizing familiar people.  One time, he couldn't remember his daughter's name.  He has little trouble with everyday tasks.  He is able to perform ADLs such as dressing, bathing, and feeding self.  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  He has not had any problems performing his duties at work.  He keeps tabs of multiple keys and knows which keys unlock specific doors.  He was at a stop light and briefly zoned out for a couple of seconds.  It was believed that he may have had a TIA.  He reports that he does not get lost on familiar routes.  He does report depressed mood.  He does not want to be around people.  He has not had any episodes of confusion.  No hallucinations or delusions.  Symptoms have mildly progressed since onset.   04/21/2017 MRI BRAIN WO: T2 and FLAIR hyperintensities in the subcortical, deep and periventricular regions.  Report notes that orientation was perpendicular to the ventricles, raising concern for multiple sclerosis.  He followed up with neurology.  He underwent LP which did not reveal findings consistent with multiple sclerosis.  Neurology believed findings were consistent with chronic small vessel ischemic changes.  He was found to have B12 deficiency and started on supplementation. 07/26/2019 LABS:  TSH 0.72, B12 594, RPR nonreactive.  B1 borderline low at 7.  Advised to start thiamine 100mg  daily. 09/11/2019 MRI BRAIN WO:  Extensive patchy and confluent cerebral white matter T2 and FLAIR signal abnormality.  Impression by radiologist included advanced chronic small vessel disease, CADASIL and less likely demyelination.  MRI CERVICAL SPINE WO:  No cord lesions.  Spondylosis with C6-7 due to osteophyte and disc protrusion  causing spinal stenosis causing slight cord deformity and bilateral foraminal stenosis potentially affecting either C6 nerve. 11/08/2019 CTA HEAD:  1. No intracranial large vessel occlusion or proximal high-grade arterial stenosis.  2. Calcified plaque within the intracranial internal carotid arteries with no more than mild luminal narrowing. 11/08/2019 CTA NECK:  1. The bilateral common carotid, internal carotid and vertebral arteries are patent within the neck without stenosis.  2. Cervical spondylosis as described and greatest at C5-C6 and C6-C7.   He had neuropsychological testing on 12/06/2019 but discontinued testing prematurely so a definitive impression could not be made.  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   Education:  12th grade Smoker since age 10.  1 ppd No longer drinks alcohol.  He was a heavy drinker for many years up until a year ago. Brothers passed away at young age.  Parents no dementia.   Past Medical History: Past Medical History:  Diagnosis Date   B12 deficiency    Bilateral hip joint arthritis 09/21/2015   BPH (benign prostatic hyperplasia)    Closed compression fracture of thoracic vertebra 09/21/2015   Contact with and (suspected) exposure to asbestos 11/05/2011   COPD (chronic obstructive pulmonary disease), mild 09/21/2015   Dysplastic nevi 12/13/2011   Esophageal spasm    GERD (gastroesophageal reflux disease)    Gilbert's syndrome    Hyperglycemia 02/24/2017   Hyperlipidemia, unspecified 03/16/2001   IMO Load 2018 R1.1   Hyperreflexia 02/23/2019   Lumbar disc disease 09/21/2015   Major depressive disorder 04/28/2018   Myocarditis    Polycythemia 03/03/2017   Sensorineural hearing loss    Tobacco use disorder 04/30/2010    Medications: Outpatient Encounter Medications as of 06/04/2021  Medication Sig   Apoaequorin (PREVAGEN) 10 MG CAPS Take by mouth.   aspirin EC 81  MG tablet Take by mouth.   b complex vitamins capsule Take 1 capsule by mouth daily.   cyanocobalamin (,VITAMIN B-12,) 1000 MCG/ML injection Inject into the muscle.   donepezil (ARICEPT) 10 MG tablet TAKE 1 TABLET BY MOUTH EVERYDAY AT BEDTIME   escitalopram (LEXAPRO) 20 MG tablet Take 1 tablet (20 mg total) by mouth daily.   QUEtiapine (SEROQUEL) 25 MG tablet TAKE 1 TABLET BY MOUTH TWICE A DAY   SYRINGE-NEEDLE, DISP, 3 ML (B-D INTEGRA SYRINGE) 25G X 5/8" 3 ML MISC by Does not apply route.   UNABLE TO FIND NEURIVA OTC TAKE ONE CAPSULE A DAY   No facility-administered encounter medications on file as of 06/04/2021.    Allergies: No Known Allergies  Family History: Family History  Problem Relation Age of Onset   Lung cancer Mother        smoker   Lung cancer Father        smoker   Heart Problems Brother        enlarge heart x1 1 brother death due to car accident    Drug abuse Brother    Healthy Child     Observations/Objective:   There were no vitals taken for this visit. No acute distress.  Alert and oriented.  Speech  fluent and not dysarthric.  Language intact.  Eyes orthophoric on primary gaze.  Face symmetric.   Follow Up Instructions:    -I discussed the assessment and treatment plan with the patient. The patient was provided an opportunity to ask questions and all were answered. The patient agreed with the plan and demonstrated an understanding of the instructions.   The patient was advised to call back or seek an in-person evaluation if the symptoms worsen or if the condition fails to improve as anticipated.   Dudley Major, DO

## 2021-06-04 ENCOUNTER — Other Ambulatory Visit: Payer: Self-pay

## 2021-06-04 ENCOUNTER — Encounter: Payer: Self-pay | Admitting: Neurology

## 2021-06-04 ENCOUNTER — Telehealth (INDEPENDENT_AMBULATORY_CARE_PROVIDER_SITE_OTHER): Payer: 59 | Admitting: Neurology

## 2021-06-04 DIAGNOSIS — F339 Major depressive disorder, recurrent, unspecified: Secondary | ICD-10-CM

## 2021-06-04 DIAGNOSIS — F01A0 Vascular dementia, mild, without behavioral disturbance, psychotic disturbance, mood disturbance, and anxiety: Secondary | ICD-10-CM | POA: Diagnosis not present

## 2021-06-07 ENCOUNTER — Telehealth: Payer: Self-pay | Admitting: Neurology

## 2021-06-07 MED ORDER — DONEPEZIL HCL 10 MG PO TABS: ORAL_TABLET | ORAL | 1 refills | Status: DC

## 2021-06-07 NOTE — Telephone Encounter (Signed)
Medication refill

## 2021-06-07 NOTE — Telephone Encounter (Signed)
1. Which medications need to be refilled? (please list name of each medication and dose if known) Donepezil  2. Which pharmacy/location (including street and city if local pharmacy) is [facility] to[facility] [address]

## 2021-06-27 ENCOUNTER — Other Ambulatory Visit: Payer: Self-pay | Admitting: Neurology

## 2021-09-04 ENCOUNTER — Other Ambulatory Visit: Payer: Self-pay | Admitting: Neurology

## 2021-09-28 ENCOUNTER — Other Ambulatory Visit: Payer: Self-pay | Admitting: Neurology

## 2021-12-03 ENCOUNTER — Other Ambulatory Visit: Payer: Self-pay | Admitting: Neurology

## 2021-12-24 NOTE — Progress Notes (Deleted)
Virtual Visit via Video Note The purpose of this virtual visit is to provide medical care while limiting exposure to the novel coronavirus.    Consent was obtained for video visit:  Yes.   Answered questions that patient had about telehealth interaction:  Yes.   I discussed the limitations, risks, security and privacy concerns of performing an evaluation and management service by telemedicine. I also discussed with the patient that there may be a patient responsible charge related to this service. The patient expressed understanding and agreed to proceed.  Pt location: Home Physician Location: office Name of referring provider:  Eber Hong, MD I connected with Dennis Dalton at patients initiation/request on 12/25/2021 at  1:30 PM EDT by video enabled telemedicine application and verified that I am speaking with the correct person using two identifiers. Pt MRN:  [id number] Pt DOB:  17-Feb-1956 Video Participants:  Dennis Dalton  Assessment and Plan:   1  Major vascular neurocognitive disorder 2  Severe cerebrovascular disease 3  Depression   Overall improved.  Donepezil '10mg'$  at bedtime.  Start memantine titrating to '10mg'$  twice daily. Escitalopram '20mg'$  daily Quetiapine '25mg'$  twice daily ASA '81mg'$  daily and treatment of stroke risk factors as managed by PCP At this time, he does not plan to drive.  However, if he changes his mind, I told him that he would first need a formal driving evaluation conducted by an occupational therapist. Follow up 6 months.   History of Present Illness:  Dennis Dalton is a 66 year old right-handed male who follows up for major vascular neurocognitive disorder   UPDATE: Current medications:  ASA '81mg'$  daily; donepezil '10mg'$  daily; escitalopram '20mg'$  QD, quetiapine '25mg'$  BID   Escitalopram was increased to '20mg'$  in April.  Due to continued agitation, he was started on quetiapine in June.  EKG showed QT/QTc 420/443 ms. Overall, his mood and irritability has  improved a bit.  He is still not driving.     HISTORY: In 2018, he started exhibiting cognitive deficits.  He has word-finding difficulty.  He has short-term memory issues such as forgetting recent conversations.  He frequently repeat questions.  He has some trouble recognizing familiar people.  One time, he couldn't remember his daughter's name.  He has little trouble with everyday tasks.  He is able to perform ADLs such as dressing, bathing, and feeding self.  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  He has not had any problems performing his duties at work.  He keeps tabs of multiple keys and knows which keys unlock specific doors.  He was at a stop light and briefly zoned out for a couple of seconds.  It was believed that he may have had a TIA.  He reports that he does not get lost on familiar routes.  He does report depressed mood.  He does not want to be around people.  He has not had any episodes of confusion.  No hallucinations or delusions.  Symptoms have mildly progressed since onset.   04/21/2017 MRI BRAIN WO: T2 and FLAIR hyperintensities in the subcortical, deep and periventricular regions.  Report notes that orientation was perpendicular to the ventricles, raising concern for multiple sclerosis.  He followed up with neurology.  He underwent LP which did not reveal findings consistent with multiple sclerosis.  Neurology believed findings were consistent with chronic small vessel ischemic changes.  He was found to have B12 deficiency and started on supplementation. 07/26/2019 LABS:  TSH 0.72, B12 594,  RPR nonreactive.  B1 borderline low at 7.  Advised to start thiamine '100mg'$  daily. 09/11/2019 MRI BRAIN WO:  Extensive patchy and confluent cerebral white matter T2 and FLAIR signal abnormality.  Impression by radiologist included advanced chronic small vessel disease, CADASIL and less likely demyelination.  MRI CERVICAL SPINE WO:  No cord lesions.  Spondylosis with C6-7 due to  osteophyte and disc protrusion causing spinal stenosis causing slight cord deformity and bilateral foraminal stenosis potentially affecting either C6 nerve. 11/08/2019 CTA HEAD:  1. No intracranial large vessel occlusion or proximal high-grade arterial stenosis.  2. Calcified plaque within the intracranial internal carotid arteries with no more than mild luminal narrowing. 11/08/2019 CTA NECK:  1. The bilateral common carotid, internal carotid and vertebral arteries are patent within the neck without stenosis.  2. Cervical spondylosis as described and greatest at C5-C6 and C6-C7.   He had neuropsychological testing on 12/06/2019 but discontinued testing prematurely so a definitive impression could not be made.  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   Education:  12th grade Smoker since age 17.  1 ppd No longer drinks alcohol.  He was a heavy drinker for many years up until a year ago. Brothers passed away at young age.  Parents no dementia.   Past Medical History: Past Medical History:  Diagnosis Date   B12 deficiency    Bilateral hip joint arthritis 09/21/2015   BPH (benign prostatic hyperplasia)    Closed compression fracture of thoracic vertebra 09/21/2015   Contact with and (suspected) exposure to asbestos 11/05/2011   COPD (chronic obstructive pulmonary disease), mild 09/21/2015   Dysplastic nevi 12/13/2011   Esophageal spasm    GERD (gastroesophageal reflux disease)    Gilbert's syndrome    Hyperglycemia 02/24/2017   Hyperlipidemia, unspecified 03/16/2001   IMO Load 2018 R1.1   Hyperreflexia 02/23/2019   Lumbar disc disease 09/21/2015   Major depressive disorder 04/28/2018   Myocarditis    Polycythemia 03/03/2017   Sensorineural hearing loss    Tobacco use disorder 04/30/2010    Medications: Outpatient Encounter Medications as of 12/25/2021  Medication Sig   Apoaequorin (PREVAGEN) 10 MG CAPS  Take by mouth. (Patient not taking: Reported on 06/04/2021)   aspirin EC 81 MG tablet Take by mouth.   b complex vitamins capsule Take 1 capsule by mouth daily.   cyanocobalamin (,VITAMIN B-12,) 1000 MCG/ML injection Inject into the muscle.   donepezil (ARICEPT) 10 MG tablet TAKE 1 TABLET BY MOUTH EVERYDAY AT BEDTIME   escitalopram (LEXAPRO) 20 MG tablet TAKE 1 TABLET BY MOUTH EVERY DAY   OVER THE COUNTER MEDICATION Focus factor   QUEtiapine (SEROQUEL) 25 MG tablet TAKE 1 TABLET BY MOUTH TWICE A DAY   SYRINGE-NEEDLE, DISP, 3 ML (B-D INTEGRA SYRINGE) 25G X 5/8" 3 ML MISC by Does not apply route.   UNABLE TO FIND NEURIVA OTC TAKE ONE CAPSULE A DAY   No facility-administered encounter medications on file as of 12/25/2021.    Allergies: No Known Allergies  Family History: Family History  Problem Relation Age of Onset   Lung cancer Mother        smoker   Lung cancer Father        smoker   Heart Problems Brother        enlarge heart x1 1 brother death due to car accident    Drug abuse Brother    Healthy Child  Observations/Objective:   *** No acute distress.  Alert and oriented.  Speech fluent and not dysarthric.  Language intact.  Eyes orthophoric on primary gaze.  Face symmetric.   Follow Up Instructions:    -I discussed the assessment and treatment plan with the patient. The patient was provided an opportunity to ask questions and all were answered. The patient agreed with the plan and demonstrated an understanding of the instructions.   The patient was advised to call back or seek an in-person evaluation if the symptoms worsen or if the condition fails to improve as anticipated.    Total Time spent in visit with the patient was:  ***, of which more than 50% of the time was spent in counseling and/or coordinating care on ***.   Pt understands and agrees with the plan of care outlined.     Dudley Major, DO

## 2021-12-25 ENCOUNTER — Telehealth: Payer: 59 | Admitting: Neurology

## 2022-01-10 ENCOUNTER — Telehealth (INDEPENDENT_AMBULATORY_CARE_PROVIDER_SITE_OTHER): Payer: 59 | Admitting: Neurology

## 2022-01-10 ENCOUNTER — Encounter: Payer: Self-pay | Admitting: Neurology

## 2022-01-10 VITALS — Ht 70.0 in | Wt 150.0 lb

## 2022-01-10 DIAGNOSIS — F01A18 Vascular dementia, mild, with other behavioral disturbance: Secondary | ICD-10-CM

## 2022-01-10 DIAGNOSIS — F32A Depression, unspecified: Secondary | ICD-10-CM | POA: Diagnosis not present

## 2022-01-10 MED ORDER — ESCITALOPRAM OXALATE 20 MG PO TABS: 20.0000 mg | ORAL_TABLET | Freq: Every day | ORAL | 1 refills | Status: DC

## 2022-01-10 MED ORDER — DONEPEZIL HCL 10 MG PO TABS: 10.0000 mg | ORAL_TABLET | Freq: Every day | ORAL | 1 refills | Status: DC

## 2022-01-10 MED ORDER — QUETIAPINE FUMARATE 25 MG PO TABS: 25.0000 mg | ORAL_TABLET | Freq: Two times a day (BID) | ORAL | 1 refills | Status: DC

## 2022-01-10 NOTE — Progress Notes (Signed)
Virtual Visit via Video Note The purpose of this virtual visit is to provide medical care while limiting exposure to the novel coronavirus.    Consent was obtained for video visit:  Yes.   Answered questions that patient had about telehealth interaction:  Yes.   I discussed the limitations, risks, security and privacy concerns of performing an evaluation and management service by telemedicine. I also discussed with the patient that there may be a patient responsible charge related to this service. The patient expressed understanding and agreed to proceed.  Pt location: Home Physician Location: office Name of referring provider:  Eber Hong, MD I connected with Dennis Dalton at patients initiation/request on 01/10/2022 at  3:10 PM EDT by video enabled telemedicine application and verified that I am speaking with the correct person using two identifiers. Pt MRN:  [id number] Pt DOB:  Oct 08, 1955 Video Participants:  Dennis Dalton  Assessment and Plan:   1  Major vascular neurocognitive disorder 2  Severe cerebrovascular disease 3  Depression    Donepezil '10mg'$  at bedtime.  Escitalopram '20mg'$  daily Quetiapine '25mg'$  twice daily ASA '81mg'$  daily and treatment of stroke risk factors as managed by PCP At this time, he does not plan to drive.  However, if he changes his mind, I told him that he would first need a formal driving evaluation conducted by an occupational therapist. Follow up 6 months.   History of Present Illness:  Dennis Dalton is a 66 year old right-handed male who follows up for major vascular neurocognitive disorder   UPDATE: Current medications:  ASA '81mg'$  daily; donepezil '10mg'$  daily; escitalopram '20mg'$  QD, quetiapine '25mg'$  BID   Escitalopram was increased to '20mg'$  in April.  Due to continued agitation, he was started on quetiapine in June.  EKG showed QT/QTc 420/443 ms. Overall, his mood and irritability has improved a bit.  He is still not driving.watches TV.  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  They may go out for lunch or walks.  His wife gets off work at 2 PM.  She is working with an elder care attorney.  He is no longer driving.  Most of the time, he sits and watches TV.  He does not wander.  He requires assistance with dressing, sometimes may get confused.  Occasionally may need assistance with bathing.  Memory has continued to get worse.  He may have trouble remembering people he doesn't see frequently.  One time his son and daughter came to visit.  He recognized his son but didn't recognize his daughter because he hadn't seen her for awhile.  Appetite is good.  He sleeps well.        HISTORY: In 2018, he started exhibiting cognitive deficits.  He has word-finding difficulty.  He has short-term memory issues such as forgetting recent conversations.  He frequently repeat questions.  He has some trouble recognizing familiar people.  One time, he couldn't remember his daughter's name.  He has little trouble with everyday tasks.  He is able to perform ADLs such as dressing, bathing, and feeding self.  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  He has not had any problems performing his duties at work.  He keeps tabs of multiple keys and knows which keys unlock specific doors.  He was at a stop light and briefly zoned out for a couple of seconds.  It was believed that he may have had a TIA.  He  reports that he does not get lost on familiar routes.  He does report depressed mood.  He does not want to be around people.  He has not had any episodes of confusion.  No hallucinations or delusions.  Symptoms have mildly progressed since onset.   04/21/2017 MRI BRAIN WO: T2 and FLAIR hyperintensities in the subcortical, deep and periventricular regions.  Report notes that orientation was perpendicular to the ventricles, raising concern for multiple sclerosis.  He followed up with neurology.  He underwent LP which did not reveal  findings consistent with multiple sclerosis.  Neurology believed findings were consistent with chronic small vessel ischemic changes.  He was found to have B12 deficiency and started on supplementation. 07/26/2019 LABS:  TSH 0.72, B12 594, RPR nonreactive.  B1 borderline low at 7.  Advised to start thiamine '100mg'$  daily. 09/11/2019 MRI BRAIN WO:  Extensive patchy and confluent cerebral white matter T2 and FLAIR signal abnormality.  Impression by radiologist included advanced chronic small vessel disease, CADASIL and less likely demyelination.  MRI CERVICAL SPINE WO:  No cord lesions.  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 11/08/2019 CTA HEAD:  1. No intracranial large vessel occlusion or proximal high-grade arterial stenosis.  2. Calcified plaque within the intracranial internal carotid arteries with no more than mild luminal narrowing. 11/08/2019 CTA NECK:  1. The bilateral common carotid, internal carotid and vertebral arteries are patent within the neck without stenosis.  2. Cervical spondylosis as described and greatest at C5-C6 and C6-C7.   He had neuropsychological testing on 12/06/2019 but discontinued testing prematurely so a definitive impression could not be made.  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   Education:  12th grade Smoker since age 9.  1 ppd No longer drinks alcohol.  He was a heavy drinker for many years up until a year ago. Brothers passed away at young age.  Parents no dementia.   Past Medical History: Past Medical History:  Diagnosis Date   B12 deficiency    Bilateral hip joint arthritis 09/21/2015   BPH (benign prostatic hyperplasia)    Closed compression fracture of thoracic vertebra 09/21/2015   Contact with and (suspected)  exposure to asbestos 11/05/2011   COPD (chronic obstructive pulmonary disease), mild 09/21/2015   Dysplastic nevi 12/13/2011   Esophageal spasm    GERD (gastroesophageal reflux disease)    Gilbert's syndrome    Hyperglycemia 02/24/2017   Hyperlipidemia, unspecified 03/16/2001   IMO Load 2018 R1.1   Hyperreflexia 02/23/2019   Lumbar disc disease 09/21/2015   Major depressive disorder 04/28/2018   Myocarditis    Polycythemia 03/03/2017   Sensorineural hearing loss    Tobacco use disorder 04/30/2010    Medications: Outpatient Encounter Medications as of 01/10/2022  Medication Sig   Apoaequorin (PREVAGEN) 10 MG CAPS Take by mouth. (Patient not taking: Reported on 06/04/2021)   aspirin EC 81 MG tablet Take by mouth.   b complex vitamins capsule Take 1 capsule by mouth daily.   cyanocobalamin (,VITAMIN B-12,) 1000 MCG/ML injection Inject into the muscle.   donepezil (ARICEPT) 10 MG tablet TAKE 1 TABLET BY MOUTH EVERYDAY AT BEDTIME   escitalopram (LEXAPRO) 20 MG tablet TAKE 1 TABLET BY MOUTH EVERY DAY   OVER THE COUNTER MEDICATION Focus factor   QUEtiapine (SEROQUEL) 25 MG tablet TAKE 1 TABLET  BY MOUTH TWICE A DAY   SYRINGE-NEEDLE, DISP, 3 ML (B-D INTEGRA SYRINGE) 25G X 5/8" 3 ML MISC by Does not apply route.   UNABLE TO FIND NEURIVA OTC TAKE ONE CAPSULE A DAY   No facility-administered encounter medications on file as of 01/10/2022.    Allergies: No Known Allergies  Family History: Family History  Problem Relation Age of Onset   Lung cancer Mother        smoker   Lung cancer Father        smoker   Heart Problems Brother        enlarge heart x1 1 brother death due to car accident    Drug abuse Brother    Healthy Child     Observations/Objective:   No acute distress.  Alert  Speech fluent and not dysarthric.  Language intact.    Follow Up Instructions:    -I discussed the assessment and treatment plan with the patient. The patient was provided an opportunity to ask questions and all  were answered. The patient agreed with the plan and demonstrated an understanding of the instructions.   The patient was advised to call back or seek an in-person evaluation if the symptoms worsen or if the condition fails to improve as anticipated.   Dudley Major, DO

## 2022-02-04 ENCOUNTER — Telehealth: Payer: Self-pay | Admitting: Neurology

## 2022-02-04 NOTE — Telephone Encounter (Signed)
LMOVM no I haven't seen the form, Will recheck box in the morning.

## 2022-02-04 NOTE — Telephone Encounter (Signed)
Patients wife called and stated that [facility] has faxed a form over to be filled out.  They wanted her to call and let us know that she was aware of this.  Can we return her call to let her know that we got the fax and its been sent back?

## 2022-02-05 NOTE — Telephone Encounter (Signed)
Mrs. Ruben Reason called to confirm form received and it has. Form carried back to Dr. Georgie Chard CMA, [facility]ley.

## 2022-02-05 NOTE — Telephone Encounter (Signed)
Patient's wife called to check the status. She is now aware the form has not been received and will request it to be resent from [facility].

## 2022-02-11 NOTE — Telephone Encounter (Signed)
Dennis Dalton called and said [facility] has not received the forms as of yet.

## 2022-02-11 NOTE — Telephone Encounter (Signed)
Spoke to the patient wife judy,please allow two weeks to have forms reviewed and filled out. Will call patient back if forms can not be filled out.

## 2022-02-20 ENCOUNTER — Telehealth: Payer: Self-pay | Admitting: Neurology

## 2022-02-20 NOTE — Telephone Encounter (Signed)
The following message was left with [facility] on 02/20/22 at 12:11 PM.   Caller states that she is calling in regards to her husband. Caller states that her husband has dementia and she is trying to get some help. Caller states that [facility] faxed over an evaluation for the doctor to fill out. Caller states that they really need this paper filled out and returned so he can get help.

## 2022-06-11 ENCOUNTER — Telehealth: Payer: Self-pay | Admitting: Neurology

## 2022-06-11 NOTE — Telephone Encounter (Signed)
Patient's wife called and left a VM.   She said the patient has been very confused the past couple of days, she'd really like speak with him or his CMA about this.

## 2022-06-11 NOTE — Telephone Encounter (Signed)
Per Patient in the last two days he been more confused. An a couple of days more moody.   Today the patient is unstable not balance.   Patient hasn't spoke to the PCP office yet to see if he has an UTI.

## 2022-06-12 NOTE — Telephone Encounter (Signed)
Called and spoke to patients wife and informed her of Dr. Georgie Chard recommendations. Patients wife will contact PCP or take patient to Urgent care for testing. Informed patients wife to let us know how this goes. Patients wife verbalized understanding and had no further questions or concerns.

## 2022-06-12 NOTE — Telephone Encounter (Signed)
Pt's wife called back in and left a message. She is wanting to speak with someone.

## 2022-06-16 ENCOUNTER — Telehealth: Payer: Self-pay | Admitting: Neurology

## 2022-06-16 NOTE — Telephone Encounter (Signed)
Patient's wife called and said the patient went to his PCP and was negative for a UTI. She'd like follow up with clinical staff.

## 2022-06-16 NOTE — Telephone Encounter (Signed)
Per the patient wife, his sitter states the patient sleeps a lot now. Int he evenings he may be more aggravated.   Per Dr.Jaffe please schedule a visit to him. Next available.

## 2022-06-17 NOTE — Telephone Encounter (Signed)
Spoke with patient wife and offered her appt 06-18-22 but she was not able to do that day so I got patient sch for 08-01-22 with [facility]
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
## 2022-06-20 ENCOUNTER — Encounter: Payer: Self-pay | Admitting: Neurology

## 2022-06-20 ENCOUNTER — Ambulatory Visit (INDEPENDENT_AMBULATORY_CARE_PROVIDER_SITE_OTHER): Payer: BC Managed Care – PPO | Admitting: Neurology

## 2022-06-20 DIAGNOSIS — F01A18 Vascular dementia, mild, with other behavioral disturbance: Secondary | ICD-10-CM

## 2022-06-20 MED ORDER — QUETIAPINE FUMARATE 25 MG PO TABS: ORAL_TABLET | ORAL | 5 refills | Status: DC

## 2022-06-20 NOTE — Patient Instructions (Signed)
Increase quetiapine to 1 pill in morning and 2 pills at night Follow up 6 months.

## 2022-07-22 ENCOUNTER — Other Ambulatory Visit: Payer: Self-pay | Admitting: Neurology

## 2022-08-01 ENCOUNTER — Ambulatory Visit: Payer: 59 | Admitting: Neurology

## 2022-08-28 ENCOUNTER — Other Ambulatory Visit: Payer: Self-pay | Admitting: Neurology

## 2022-09-04 ENCOUNTER — Telehealth: Payer: Self-pay | Admitting: Neurology

## 2022-09-04 ENCOUNTER — Telehealth: Payer: Self-pay
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
## 2022-09-04 NOTE — Telephone Encounter (Signed)
Pt's wife called back in stating she has not heard from anyone yet about the message she left this morning. She is not sure what to do.

## 2022-09-05 ENCOUNTER — Telehealth: Payer: Self-pay | Admitting: Neurology

## 2022-09-05 ENCOUNTER — Other Ambulatory Visit: Payer: Self-pay | Admitting: Neurology

## 2022-09-05 ENCOUNTER — Telehealth: Payer: Self-pay | Admitting: Anesthesiology

## 2022-09-05 DIAGNOSIS — Z79899 Other long term (current) drug therapy: Secondary | ICD-10-CM

## 2022-09-05 NOTE — Telephone Encounter (Signed)
Pt's wife called in wanting to find out if the lab results need to come back before the Depakote prescription can be called in?

## 2022-09-05 NOTE — Telephone Encounter (Signed)
Labs faxed to [phone].

## 2022-09-05 NOTE — Telephone Encounter (Signed)
Patient wife advised of Dr.Jaffe note,  Depakote is a medication that can help with mood and agitation.  However, I would first like to check a CBC and liver function panel.   Wife will call patient PCP office to see if the patient can come there to have labs drawn.

## 2022-09-05 NOTE — Telephone Encounter (Signed)
See previous encounter. We have 24 hours to return call/message.

## 2022-09-05 NOTE — Telephone Encounter (Signed)
Pt's wife called stating this is where the lab work orders need to go to Dr Jerene Dilling fax number [phone]. Pt has appointment today at 1:30 pm.

## 2022-09-08 ENCOUNTER — Telehealth: Payer: Self-pay

## 2022-09-08 LAB — CBC WITH DIFFERENTIAL/PLATELET
Absolute Monocytes: 601 cells/uL (ref 200–950)
Basophils Absolute: 64 cells/uL (ref 0–200)
Basophils Relative: 0.7 %
Eosinophils Absolute: 173 cells/uL (ref 15–500)
Eosinophils Relative: 1.9 %
HCT: 45.1 % (ref 38.5–50.0)
Hemoglobin: 16.1 g/dL (ref 13.2–17.1)
Lymphs Abs: 2330 cells/uL (ref 850–3900)
MCH: 34 pg — ABNORMAL HIGH (ref 27.0–33.0)
MCHC: 35.7 g/dL (ref 32.0–36.0)
MCV: 95.3 fL (ref 80.0–100.0)
MPV: 10.7 fL (ref 7.5–12.5)
Monocytes Relative: 6.6 %
Neutro Abs: 5933 cells/uL (ref 1500–7800)
Neutrophils Relative %: 65.2 %
Platelets: 241 10*3/uL (ref 140–400)
RBC: 4.73 10*6/uL (ref 4.20–5.80)
RDW: 12.6 % (ref 11.0–15.0)
Total Lymphocyte: 25.6 %
WBC: 9.1 10*3/uL (ref 3.8–10.8)

## 2022-09-08 LAB — HEPATIC FUNCTION PANEL
AG Ratio: 1.9 (calc) (ref 1.0–2.5)
ALT: 15 U/L (ref 9–46)
AST: 15 U/L (ref 10–35)
Albumin: 4.5 g/dL (ref 3.6–5.1)
Alkaline phosphatase (APISO): 60 U/L (ref 35–144)
Bilirubin, Direct: 0.2 mg/dL (ref 0.0–0.2)
Globulin: 2.4 g/dL (calc) (ref 1.9–3.7)
Indirect Bilirubin: 1.3 mg/dL (calc) — ABNORMAL HIGH (ref 0.2–1.2)
Total Bilirubin: 1.5 mg/dL — ABNORMAL HIGH (ref 0.2–1.2)
Total Protein: 6.9 g/dL (ref 6.1–8.1)

## 2022-09-08 LAB — HOUSE ACCOUNT TRACKING

## 2022-09-08 MED ORDER — DIVALPROEX SODIUM ER 250 MG PO TB24: 250.0000 mg | ORAL_TABLET | Freq: Every day | ORAL | 0 refills | Status: DC

## 2022-09-08 NOTE — Telephone Encounter (Signed)
Per Dr.Jaffe,    LMOVM for patient wife, Please give PCP office to get Labs results faxed over to the office. I will reach out as well.  Front desk per [employer] please call to give an Urgent slot to the patient. The visit may be VV.
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
## 2022-09-08 NOTE — Addendum Note (Signed)
Addended by: Venetia Night on: 09/08/2022 04:06 PM   Modules accepted: Orders

## 2022-09-08 NOTE — Telephone Encounter (Signed)
Dennis Dalton called from [facility], they received a call from patients wife asking why results werent received, they advised that we would need to contact [phone] to get the results. Per wife they were told we would contact to get results.

## 2022-09-08 NOTE — Telephone Encounter (Signed)
  Labs (CBC and CMP) reviewed.  Please send prescription for Depakote ER '250mg'$  daily.  Patient advised of note above from [facility].

## 2022-09-09 ENCOUNTER — Encounter: Payer: Self-pay | Admitting: Neurology

## 2022-09-09 ENCOUNTER — Telehealth (INDEPENDENT_AMBULATORY_CARE_PROVIDER_SITE_OTHER): Payer: BC Managed Care – PPO | Admitting: Neurology

## 2022-09-09 DIAGNOSIS — I679 Cerebrovascular disease, unspecified: Secondary | ICD-10-CM | POA: Diagnosis not present

## 2022-09-09 DIAGNOSIS — F01A18 Vascular dementia, mild, with other behavioral disturbance: Secondary | ICD-10-CM

## 2022-09-09 DIAGNOSIS — Z79899 Other long term (current) drug therapy: Secondary | ICD-10-CM | POA: Diagnosis not present

## 2022-09-09 NOTE — Patient Instructions (Addendum)
  Start divaloproex ER '250mg'$  daily.  We can increase dose in 2 weeks if needed Continue quetiapine '50mg'$  twice daily.  We can increase dose if needed.  Patient and wife aware of black box warning.  I would like to see a recent EKG before increasing dose further. Donepezil '10mg'$  at bedtime.  Escitalopram '20mg'$  daily Follow up on 5/3 as scheduled.

## 2022-09-09 NOTE — Addendum Note (Signed)
Addended by: Venetia Night on: 09/09/2022 10:13 AM   Modules accepted: Orders

## 2022-09-12 ENCOUNTER — Telehealth: Payer: Self-pay

## 2022-09-12 NOTE — Telephone Encounter (Signed)
They will let you know, to schedule follow up

## 2022-09-12 NOTE — Telephone Encounter (Signed)
Patients wife wanted to inform Dr.Jaffe that the patient is currently in the hospital and is looking to be discharged within the next couple of days.

## 2022-09-25 ENCOUNTER — Other Ambulatory Visit: Payer: Self-pay | Admitting: Neurology

## 2022-09-26 ENCOUNTER — Telehealth: Payer: Self-pay | Admitting: Anesthesiology

## 2022-09-26 NOTE — Telephone Encounter (Signed)
Advised that Hospiitalist will have to do this . Thanked me for calling

## 2022-09-26 NOTE — Telephone Encounter (Signed)
Pt's wife left message with AN stating pt has been admitted to the hospital and has been there for 15 days and is on the wait list for a mental health facility. Pt's wife Bethena Roys would like to speak to Dr Tomi Likens about this.

## 2022-10-01 ENCOUNTER — Other Ambulatory Visit: Payer: Self-pay | Admitting: Neurology

## 2022-10-17 ENCOUNTER — Telehealth: Payer: 59 | Admitting: Neurology

## 2022-10-17 ENCOUNTER — Telehealth: Payer: Self-pay | Admitting: Neurology

## 2022-10-17 NOTE — Telephone Encounter (Signed)
Pt's wife called in stating the pt is now out of the hospital and is at [facility] in [place]. The pt's daughter has told the people at [facility] house that he was diagnosed with Lewy Body Dementia. She only remembers hearing Vascular Dementia. She would like to confirm the diagnosis.

## 2022-10-17 NOTE — Telephone Encounter (Signed)
Called and informed pt wife of Dr. Tomi Likens answer. She understood.

## 2022-10-25 ENCOUNTER — Other Ambulatory Visit: Payer: Self-pay | Admitting: Neurology

## 2022-12-26 ENCOUNTER — Ambulatory Visit: Payer: BC Managed Care – PPO | Admitting: Neurology

## 2023-01-01 NOTE — Progress Notes (Signed)
Virtual Visit via Video Note  Consent was obtained for video visit:  Yes.   Answered questions that patient had about telehealth interaction:  Yes.   I discussed the limitations, risks, security and privacy concerns of performing an evaluation and management service by telemedicine. I also discussed with the patient that there may be a patient responsible charge related to this service. The patient expressed understanding and agreed to proceed.  Pt location: Home Physician Location: office Name of referring provider:  Kathlee Nations, MD I connected with Ishmael Holter at patients initiation/request on 01/05/2023 at 10:10 AM EDT by video enabled telemedicine application and verified that I am speaking with the correct person using two identifiers. Pt MRN:  [id number] Pt DOB:  05/14/1956 Video Participants:  wife.    Assessment/Plan:    1  Major vascular neurocognitive disorder 2  Severe cerebrovascular disease 3  Depression    Receiving care at his new residence, [facility] Depakote ER 250mg  daily.   quetiapine 50mg  twice daily.   Donepezil 10mg  at bedtime.  Escitalopram 20mg  daily ASA 81mg  daily and treatment of stroke risk factors as managed by PCP Follow up in 6 months.     Subjective:  Dennis Dalton is a 67 year old right-handed male who follows up for major vascular neurocognitive disorder.  History obtained by his wife as he is sleeping.  History supplemented by hospital records.   UPDATE: Current medications:  ASA 81mg  daily; donepezil 10mg  daily; escitalopram 20mg  QD, quetiapine 50mg  BID  Last seen on 1/16.  Due to increased combativeness and agitation, started Depakote.  He had been having falls.  The next day, he was at the adult daycare center when he tried to climb a fence.  Police were called and he was brought to [facility] ED where CT head revealed subarachnoid hemorrhage with small right parafalcine subdural hematoma.  He was transferred  to [facility] where neurosurgery determined surgical intervention was not warranted.  As he is on aspirin, he was given DDAVP and monitored in the PCU.  Repeat CT head the following day revealed stable thin 2-3 mm acute SDH layering along the right aspect of the mid-posterior falx cerebri and multiple small foci of subarachnoid hemorrhage within the left temporal, posterior left parietal and anterior inferior right temporal sucli.  Given Haldol in the hospital.  Seen by psychiatry who increased quetiapine.  He is currently residing in the memory care unit at [facility] nursing home in [place], [place]       HISTORY: In 2018, he started exhibiting cognitive deficits.  He has word-finding difficulty.  He has short-term memory issues such as forgetting recent conversations.  He frequently repeat questions.  He has some trouble recognizing familiar people.  One time, he couldn't remember his daughter's name.  He has little trouble with everyday tasks.  He is able to perform ADLs such as dressing, bathing, and feeding self.  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  He has not had any problems performing his duties at work.  He keeps tabs of multiple keys and knows which keys unlock specific doors.  He was at a stop light and briefly zoned out for a couple of seconds.  It was believed that he may have had a TIA.  He reports that he does not get lost on familiar routes.  He does report depressed mood.  He does not want to be around people.  He has  not had any episodes of confusion.  No hallucinations or delusions.  Symptoms have mildly progressed since onset.   04/21/2017 MRI BRAIN WO: T2 and FLAIR hyperintensities in the subcortical, deep and periventricular regions.  Report notes that orientation was perpendicular to the ventricles, raising concern for multiple sclerosis.  He followed up with neurology.  He underwent LP which did not reveal findings consistent with multiple  sclerosis.  Neurology believed findings were consistent with chronic small vessel ischemic changes.  He was found to have B12 deficiency and started on supplementation. 07/26/2019 LABS:  TSH 0.72, B12 594, RPR nonreactive.  B1 borderline low at 7.  Advised to start thiamine 100mg  daily. 09/11/2019 MRI BRAIN WO:  Extensive patchy and confluent cerebral white matter T2 and FLAIR signal abnormality.  Impression by radiologist included advanced chronic small vessel disease, CADASIL and less likely demyelination.  MRI CERVICAL SPINE WO:  No cord lesions.  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 11/08/2019 CTA HEAD:  1. No intracranial large vessel occlusion or proximal high-grade arterial stenosis.  2. Calcified plaque within the intracranial internal carotid arteries with no more than mild luminal narrowing. 11/08/2019 CTA NECK:  1. The bilateral common carotid, internal carotid and vertebral arteries are patent within the neck without stenosis.  2. Cervical spondylosis as described and greatest at C5-C6 and C6-C7.   He had neuropsychological testing on 12/06/2019 but discontinued testing prematurely so a definitive impression could not be made.  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   Education:  12th grade Smoker since age 60.  1 ppd No longer drinks alcohol.  He was a heavy drinker for many years up until a year ago. Brothers passed away at young age.  Parents no dementia.   Past Medical History: Past Medical History:  Diagnosis Date   B12 deficiency    Bilateral hip joint arthritis 09/21/2015   BPH (benign prostatic hyperplasia)    Closed compression fracture of thoracic vertebra 09/21/2015   Contact with and (suspected) exposure to asbestos 11/05/2011   COPD  (chronic obstructive pulmonary disease), mild 09/21/2015   Dysplastic nevi 12/13/2011   Esophageal spasm    GERD (gastroesophageal reflux disease)    Gilbert's syndrome    Hyperglycemia 02/24/2017   Hyperlipidemia, unspecified 03/16/2001   IMO Load 2018 R1.1   Hyperreflexia 02/23/2019   Lumbar disc disease 09/21/2015   Major depressive disorder 04/28/2018   Myocarditis    Polycythemia 03/03/2017   Sensorineural hearing loss    Tobacco use disorder 04/30/2010    Medications: Outpatient Encounter Medications as of 01/05/2023  Medication Sig   aspirin EC 81 MG tablet Take by mouth.   b complex vitamins capsule Take 1 capsule by mouth daily.   CRANBERRY EXTRACT PO Take by mouth.   cyanocobalamin (VITAMIN B12) 1000 MCG tablet Take by mouth.   divalproex (DEPAKOTE ER) 250 MG 24 hr tablet TAKE 1 TABLET BY MOUTH EVERY DAY   donepezil (ARICEPT) 10 MG tablet TAKE 1 TABLET BY MOUTH EVERYDAY AT BEDTIME   escitalopram (LEXAPRO) 20 MG tablet TAKE 1 TABLET BY MOUTH EVERY DAY   Multiple Vitamin (MULTIVITAMIN PO) Take by mouth.   OVER THE COUNTER MEDICATION Focus factor   QUEtiapine (SEROQUEL) 25 MG tablet TAKE 1 TABLET IN MORNING AND 2 TABLETS AT NIGHT. (Patient taking differently: 25  mg 2 (two) times daily. Take 2  tablet in morning and 2 tablets at night.)   SYRINGE-NEEDLE, DISP, 3 ML (B-D INTEGRA SYRINGE) 25G X 5/8" 3 ML MISC by Does not apply route.   UNABLE TO FIND NEURIVA OTC TAKE ONE CAPSULE A DAY   No facility-administered encounter medications on file as of 01/05/2023.    Allergies: No Known Allergies  Family History: Family History  Problem Relation Age of Onset   Lung cancer Mother        smoker   Lung cancer Father        smoker   Heart Problems Brother        enlarge heart x1 1 brother death due to car accident    Drug abuse Brother    Healthy Child     Observations/Objective:   No acute distress.  Alert and oriented.  Speech fluent and not dysarthric.  Language intact.  Eyes  orthophoric on primary gaze.  Face symmetric.   Follow Up Instructions:    -I discussed the assessment and treatment plan with the patient. The patient was provided an opportunity to ask questions and all were answered. The patient agreed with the plan and demonstrated an understanding of the instructions.   The patient was advised to call back or seek an in-person evaluation if the symptoms worsen or if the condition fails to improve as anticipated.   Cira Servant, DO

## 2023-01-05 ENCOUNTER — Telehealth (INDEPENDENT_AMBULATORY_CARE_PROVIDER_SITE_OTHER): Payer: BC Managed Care – PPO | Admitting: Neurology

## 2023-01-05 ENCOUNTER — Encounter: Payer: Self-pay | Admitting: Neurology

## 2023-01-05 DIAGNOSIS — I679 Cerebrovascular disease, unspecified: Secondary | ICD-10-CM

## 2023-01-05 DIAGNOSIS — F01A18 Vascular dementia, mild, with other behavioral disturbance: Secondary | ICD-10-CM

## 2023-01-05 DIAGNOSIS — F32A Depression, unspecified: Secondary | ICD-10-CM | POA: Diagnosis not present

## 2023-07-17 ENCOUNTER — Telehealth: Payer: Self-pay | Admitting: Neurology
